# Patient Record
Sex: Male | Born: 1960 | Race: Asian | Hispanic: No | Marital: Single | Smoking: Never smoker
Health system: Southern US, Community
[De-identification: ages and names within clinical notes are randomized; demographics above are authoritative.]

## PROBLEM LIST (undated history)

## (undated) DIAGNOSIS — Z789 Other specified health status: Secondary | ICD-10-CM

## (undated) HISTORY — PX: NO PAST SURGERIES: SHX2092

---

## 2015-09-01 ENCOUNTER — Emergency Department (HOSPITAL_COMMUNITY): Payer: BLUE CROSS/BLUE SHIELD

## 2015-09-01 ENCOUNTER — Ambulatory Visit (INDEPENDENT_AMBULATORY_CARE_PROVIDER_SITE_OTHER): Payer: BLUE CROSS/BLUE SHIELD

## 2015-09-01 ENCOUNTER — Ambulatory Visit (INDEPENDENT_AMBULATORY_CARE_PROVIDER_SITE_OTHER): Payer: BLUE CROSS/BLUE SHIELD | Admitting: Physician Assistant

## 2015-09-01 ENCOUNTER — Inpatient Hospital Stay (HOSPITAL_COMMUNITY)
Admission: EM | Admit: 2015-09-01 | Discharge: 2015-09-04 | DRG: 373 | Disposition: A | Discharge status: Home / Self Care | Payer: BLUE CROSS/BLUE SHIELD | Attending: General Surgery | Admitting: General Surgery

## 2015-09-01 ENCOUNTER — Encounter (HOSPITAL_COMMUNITY): Payer: Self-pay | Admitting: Emergency Medicine

## 2015-09-01 VITALS — BP 126/80 | HR 81 | Temp 98.9°F | Resp 17 | Ht 62.5 in | Wt 135.0 lb

## 2015-09-01 DIAGNOSIS — D72829 Elevated white blood cell count, unspecified: Secondary | ICD-10-CM | POA: Diagnosis present

## 2015-09-01 DIAGNOSIS — K353 Acute appendicitis with localized peritonitis, without perforation or gangrene: Secondary | ICD-10-CM

## 2015-09-01 DIAGNOSIS — K3532 Acute appendicitis with perforation and localized peritonitis, without abscess: Secondary | ICD-10-CM

## 2015-09-01 DIAGNOSIS — L309 Dermatitis, unspecified: Secondary | ICD-10-CM | POA: Diagnosis not present

## 2015-09-01 DIAGNOSIS — R1031 Right lower quadrant pain: Secondary | ICD-10-CM

## 2015-09-01 DIAGNOSIS — K5900 Constipation, unspecified: Secondary | ICD-10-CM

## 2015-09-01 LAB — POCT CBC
Granulocyte percent: 83.3 %G — AB (ref 37–80)
HCT, POC: 41.5 % — AB (ref 43.5–53.7)
HEMOGLOBIN: 14.2 g/dL (ref 14.1–18.1)
LYMPH, POC: 1.5 (ref 0.6–3.4)
MCH, POC: 28.9 pg (ref 27–31.2)
MCHC: 34.2 g/dL (ref 31.8–35.4)
MCV: 84.4 fL (ref 80–97)
MID (cbc): 0.2 (ref 0–0.9)
MPV: 6.1 fL (ref 0–99.8)
POC Granulocyte: 8.5 — AB (ref 2–6.9)
POC LYMPH PERCENT: 14.5 %L (ref 10–50)
POC MID %: 2.2 %M (ref 0–12)
Platelet Count, POC: 267 10*3/uL (ref 142–424)
RBC: 4.92 M/uL (ref 4.69–6.13)
RDW, POC: 14.5 %
WBC: 10.2 10*3/uL (ref 4.6–10.2)

## 2015-09-01 LAB — DIFFERENTIAL
Basophils Absolute: 0 10*3/uL (ref 0.0–0.1)
Basophils Relative: 0 %
EOS PCT: 4 %
Eosinophils Absolute: 0.5 10*3/uL (ref 0.0–0.7)
LYMPHS ABS: 1.7 10*3/uL (ref 0.7–4.0)
LYMPHS PCT: 14 %
MONO ABS: 0.6 10*3/uL (ref 0.1–1.0)
MONOS PCT: 5 %
Neutro Abs: 8.9 10*3/uL — ABNORMAL HIGH (ref 1.7–7.7)
Neutrophils Relative %: 77 %

## 2015-09-01 LAB — POCT URINALYSIS DIP (MANUAL ENTRY)
Bilirubin, UA: NEGATIVE
Glucose, UA: NEGATIVE
Ketones, POC UA: NEGATIVE
LEUKOCYTES UA: NEGATIVE
NITRITE UA: NEGATIVE
PH UA: 7
PROTEIN UA: NEGATIVE
Spec Grav, UA: 1.02
Urobilinogen, UA: 0.2

## 2015-09-01 LAB — COMPREHENSIVE METABOLIC PANEL
ALBUMIN: 4.1 g/dL (ref 3.5–5.0)
ALK PHOS: 58 U/L (ref 38–126)
ALT: 21 U/L (ref 17–63)
ANION GAP: 8 (ref 5–15)
AST: 22 U/L (ref 15–41)
BILIRUBIN TOTAL: 0.5 mg/dL (ref 0.3–1.2)
BUN: 18 mg/dL (ref 6–20)
CALCIUM: 8.8 mg/dL — AB (ref 8.9–10.3)
CO2: 26 mmol/L (ref 22–32)
Chloride: 104 mmol/L (ref 101–111)
Creatinine, Ser: 0.84 mg/dL (ref 0.61–1.24)
GFR calc Af Amer: >60 mL/min (ref >60)
GLUCOSE: 99 mg/dL (ref 65–99)
POTASSIUM: 4.3 mmol/L (ref 3.5–5.1)
Sodium: 138 mmol/L (ref 135–145)
TOTAL PROTEIN: 7.7 g/dL (ref 6.5–8.1)

## 2015-09-01 LAB — CBC
HCT: 43.1 % (ref 39.0–52.0)
Hemoglobin: 13.9 g/dL (ref 13.0–17.0)
MCH: 28.4 pg (ref 26.0–34.0)
MCHC: 32.3 g/dL (ref 30.0–36.0)
MCV: 88 fL (ref 78.0–100.0)
Platelets: 239 10*3/uL (ref 150–400)
RBC: 4.9 MIL/uL (ref 4.22–5.81)
RDW: 13.6 % (ref 11.5–15.5)
WBC: 11.8 10*3/uL — AB (ref 4.0–10.5)

## 2015-09-01 LAB — URINALYSIS, ROUTINE W REFLEX MICROSCOPIC
BILIRUBIN URINE: NEGATIVE
Glucose, UA: NEGATIVE mg/dL
KETONES UR: NEGATIVE mg/dL
LEUKOCYTES UA: NEGATIVE
NITRITE: NEGATIVE
PROTEIN: NEGATIVE mg/dL
Specific Gravity, Urine: 1.02 (ref 1.005–1.030)
pH: 7.5 (ref 5.0–8.0)

## 2015-09-01 LAB — URINE MICROSCOPIC-ADD ON: Bacteria, UA: NONE SEEN

## 2015-09-01 LAB — POC MICROSCOPIC URINALYSIS (UMFC): Mucus: ABSENT

## 2015-09-01 LAB — LIPASE, BLOOD: Lipase: 24 U/L (ref 11–51)

## 2015-09-01 MED ORDER — METRONIDAZOLE IN NACL 5-0.79 MG/ML-% IV SOLN
500.0000 mg | Freq: Once | INTRAVENOUS | Status: AC
  Administered 2015-09-01: 500 mg via INTRAVENOUS
  Filled 2015-09-01: qty 100

## 2015-09-01 MED ORDER — DEXTROSE 5 % IV SOLN
1.0000 g | Freq: Once | INTRAVENOUS | Status: AC
  Administered 2015-09-01: 1 g via INTRAVENOUS
  Filled 2015-09-01: qty 10

## 2015-09-01 MED ORDER — SODIUM CHLORIDE 0.9 % IV BOLUS (SEPSIS)
1000.0000 mL | Freq: Once | INTRAVENOUS | Status: AC
  Administered 2015-09-01: 1000 mL via INTRAVENOUS

## 2015-09-01 MED ORDER — IOHEXOL 300 MG/ML  SOLN
100.0000 mL | Freq: Once | INTRAMUSCULAR | Status: AC | PRN
  Administered 2015-09-01: 100 mL via INTRAVENOUS

## 2015-09-01 MED ORDER — INFLUENZA VAC SPLIT QUAD 0.5 ML IM SUSY
0.5000 mL | PREFILLED_SYRINGE | INTRAMUSCULAR | Status: DC
Start: 2015-09-02 — End: 2015-09-02
  Filled 2015-09-01: qty 0.5

## 2015-09-01 MED ORDER — MORPHINE SULFATE (PF) 4 MG/ML IV SOLN
4.0000 mg | Freq: Once | INTRAVENOUS | Status: AC
  Administered 2015-09-01: 4 mg via INTRAVENOUS
  Filled 2015-09-01: qty 1

## 2015-09-01 NOTE — Progress Notes (Signed)
[facility] [address] [place] [address] [phone]  Date:  09/01/2015   Name:  Adam Ruiz   DOB:  12-20-1960   MRN:  [id number]  PCP:  No primary care provider on file.    History of Present Illness:  Adam Ruiz is a 55 y.o. male patient who presents to [facility] for cc of abdominal pain for 3 days.  This is at the central abdominal pain that moves around.  The pain is constant.  He has never had this pain before.  No nausea.  No dysuria, hematuria, or frequency.  No fever.  He has a constant headache.  No constipation or diarrhea.  He is having constipation.  He had a cup of coffee and a small meal.  He has no anorexia.      There are no active problems to display for this patient.   History reviewed. No pertinent past medical history.  History reviewed. No pertinent past surgical history.  Social History  Substance Use Topics  . Smoking status: Never Smoker   . Smokeless tobacco: None  . Alcohol Use: No    History reviewed. No pertinent family history.  No Known Allergies  Medication list has been reviewed and updated.  No current outpatient prescriptions on file prior to visit.   No current facility-administered medications on file prior to visit.    ROS ROS otherwise unremarkable unless listed above.   Physical Examination: BP 126/80 mmHg  Pulse 81  Temp(Src) 98.9 F (37.2 C) (Oral)  Resp 17  Ht 5' 2.5" (1.588 m)  Wt 135 lb (61.236 kg)  BMI 24.28 kg/m2  SpO2 99% Ideal Body Weight: Weight in (lb) to have BMI = 25: 138.6  Physical Exam  Constitutional: He is oriented to person, place, and time. He appears well-developed and well-nourished. No distress.  HENT:  Head: Normocephalic and atraumatic.  Eyes: Conjunctivae and EOM are normal. Pupils are equal, round, and reactive to light.  Cardiovascular: Normal rate.   Pulmonary/Chest: Effort normal. No respiratory distress.  Abdominal: Soft. Normal appearance and bowel sounds are  normal. There is no hepatosplenomegaly. There is tenderness in the right lower quadrant. There is rigidity, rebound, guarding and tenderness at McBurney's point. There is no CVA tenderness.  Referred pain at rlq with LLQ deep palpation.  Neurological: He is alert and oriented to person, place, and time.  Skin: Skin is warm and dry. He is not diaphoretic.  Dry scaly patches along the upper arms and inferiorly to the lateral side of upper arm.  No erythema or drainage.    Psychiatric: He has a normal mood and affect. His behavior is normal.   Results for orders placed or performed in visit on 09/01/15  POCT CBC  Result Value Ref Range   WBC 10.2 4.6 - 10.2 K/uL   Lymph, poc 1.5 0.6 - 3.4   POC LYMPH PERCENT 14.5 10 - 50 %L   MID (cbc) 0.2 0 - 0.9   POC MID % 2.2 0 - 12 %M   POC Granulocyte 8.5 (A) 2 - 6.9   Granulocyte percent 83.3 (A) 37 - 80 %G   RBC 4.92 4.69 - 6.13 M/uL   Hemoglobin 14.2 14.1 - 18.1 g/dL   HCT, POC 29.5 (A) 62.1 - 53.7 %   MCV 84.4 80 - 97 fL   MCH, POC 28.9 27 - 31.2 pg   MCHC 34.2 31.8 - 35.4 g/dL   RDW, POC 30.8 %  Platelet Count, POC 267 142 - 424 K/uL   MPV 6.1 0 - 99.8 fL  POCT urinalysis dipstick  Result Value Ref Range   Color, UA yellow yellow   Clarity, UA clear clear   Glucose, UA negative negative   Bilirubin, UA negative negative   Ketones, POC UA negative negative   Spec Grav, UA 1.020    Blood, UA trace-lysed (A) negative   pH, UA 7.0    Protein Ur, POC negative negative   Urobilinogen, UA 0.2    Nitrite, UA Negative Negative   Leukocytes, UA Negative Negative  POCT Microscopic Urinalysis ([facility])  Result Value Ref Range   WBC,UR,HPF,POC None None WBC/hpf   RBC,UR,HPF,POC None None RBC/hpf   Bacteria Few (A) None, Too numerous to count   Mucus Absent Absent   Epithelial Cells, UR Per Microscopy Few (A) None, Too numerous to count cells/hpf      Assessment and Plan: Adam Ruiz is a 55 y.o. male who is here today for rlq abdominal  pain.  DIFF DX: Appendicitis (w,w/o encased ruptured abscess), kidney stone, bowel obstruction, peritoneal abscess. Abdominal pain is acute, and must rule out acute processes.  I am sending him to [facility].  Advised aunt to transport him to [facility].  Spoke with charge nurse Mecon, of patient's PE, and high-risk.  Advised patient of no food or drink.  Right lower quadrant abdominal pain - Plan: POCT CBC, POCT urinalysis dipstick, POCT Microscopic Urinalysis ([facility]), DG Abd 1 View  Trena Platt, PA-C [facility] 09/01/2015 10:41 AM

## 2015-09-01 NOTE — ED Notes (Signed)
Pt c/o RLQ pain with rebound tenderness x 3 days. Denies N/V/D/Fevers. A&Ox4 and ambulatory. Pt does not speak english, has family member with him who can interpret. Pt was seen at urgent care and had xray done. Xray was negative. WBC10.2, Few bacteria in urine. Urgent care had possible concern for rupture appendix.

## 2015-09-01 NOTE — H&P (Signed)
Chief Complaint:  Right lower quadrant pain  History of Present Illness:  Adam Ruiz is an 55 y.o. male from [place] who presents with a 3 day history of worsening right lower quadrant pain.  His family took him to an urgent care today and he was referred to the [facility] ER.  CT scan obtained and reviewed by me shows a large phlegmon which with his history likely is a walled off perforated appendicitis.    History reviewed. No pertinent past medical history.  History reviewed. No pertinent past surgical history.  Current Facility-Administered Medications  Medication Dose Route Frequency Provider Last Rate Last Dose  . [START ON 09/02/2015] Influenza vac split quadrivalent PF (FLUARIX) injection 0.5 mL  0.5 mL Intramuscular Tomorrow-1000 Sharlett Iles, MD       Current Outpatient Prescriptions  Medication Sig Dispense Refill  . acetaminophen (TYLENOL) 500 MG tablet Take 500-1,000 mg by mouth every 6 (six) hours as needed for moderate pain or headache.     Review of patient's allergies indicates no known allergies. No family history on file. Social History:   reports that he has never smoked. He does not have any smokeless tobacco history on file. He reports that he does not drink alcohol or use illicit drugs.   REVIEW OF SYSTEMS : Negative   Physical Exam:   Blood pressure 133/79, pulse 77, temperature 98 F (36.7 C), temperature source Oral, resp. rate 18, SpO2 96 %. There is no weight on file to calculate BMI.  Gen:  WDWN Asian male NAD  Neurological: Alert and oriented to person, place, and time. Motor and sensory function is grossly intact  Head: Normocephalic and atraumatic.  Eyes: Conjunctivae are normal. Pupils are equal, round, and reactive to light. No scleral icterus.  Neck: Normal range of motion. Neck supple. No tracheal deviation or thyromegaly present.  Cardiovascular:  SR without murmurs or gallops.  No carotid bruits Breast:  Not examined Respiratory: Effort normal.   No respiratory distress. No chest wall tenderness. Breath sounds normal.  No wheezes, rales or rhonchi.  Abdomen:  Tender and guarding in the right lower quadrant GU:  Male genitals Musculoskeletal: Normal range of motion. Extremities are nontender. No cyanosis, edema or clubbing noted Lymphadenopathy: No cervical, preauricular, postauricular or axillary adenopathy is present Skin: Skin is warm and dry. No rash noted. No diaphoresis. No erythema. No pallor. Pscyh: Normal mood and affect. Behavior is normal. Judgment and thought content normal.   LABORATORY RESULTS: Results for orders placed or performed during the hospital encounter of 09/01/15 (from the past 48 hour(s))  Lipase, blood     Status: None   Collection Time: 09/01/15  2:08 PM  Result Value Ref Range   Lipase 24 11 - 51 U/L  Comprehensive metabolic panel     Status: Abnormal   Collection Time: 09/01/15  2:08 PM  Result Value Ref Range   Sodium 138 135 - 145 mmol/L   Potassium 4.3 3.5 - 5.1 mmol/L   Chloride 104 101 - 111 mmol/L   CO2 26 22 - 32 mmol/L   Glucose, Bld 99 65 - 99 mg/dL   BUN 18 6 - 20 mg/dL   Creatinine, Ser 0.84 0.61 - 1.24 mg/dL   Calcium 8.8 (L) 8.9 - 10.3 mg/dL   Total Protein 7.7 6.5 - 8.1 g/dL   Albumin 4.1 3.5 - 5.0 g/dL   AST 22 15 - 41 U/L   ALT 21 17 - 63 U/L   Alkaline Phosphatase  58 38 - 126 U/L   Total Bilirubin 0.5 0.3 - 1.2 mg/dL   GFR calc non Af Amer >60 >60 mL/min   GFR calc Af Amer >60 >60 mL/min    Comment: (NOTE) The eGFR has been calculated using the CKD EPI equation. This calculation has not been validated in all clinical situations. eGFR's persistently <60 mL/min signify possible Chronic Kidney Disease.    Anion gap 8 5 - 15  CBC     Status: Abnormal   Collection Time: 09/01/15  2:08 PM  Result Value Ref Range   WBC 11.8 (H) 4.0 - 10.5 K/uL   RBC 4.90 4.22 - 5.81 MIL/uL   Hemoglobin 13.9 13.0 - 17.0 g/dL   HCT 43.1 39.0 - 52.0 %   MCV 88.0 78.0 - 100.0 fL   MCH 28.4  26.0 - 34.0 pg   MCHC 32.3 30.0 - 36.0 g/dL   RDW 13.6 11.5 - 15.5 %   Platelets 239 150 - 400 K/uL  Differential     Status: Abnormal   Collection Time: 09/01/15  2:08 PM  Result Value Ref Range   Neutrophils Relative % 77 %   Neutro Abs 8.9 (H) 1.7 - 7.7 K/uL   Lymphocytes Relative 14 %   Lymphs Abs 1.7 0.7 - 4.0 K/uL   Monocytes Relative 5 %   Monocytes Absolute 0.6 0.1 - 1.0 K/uL   Eosinophils Relative 4 %   Eosinophils Absolute 0.5 0.0 - 0.7 K/uL   Basophils Relative 0 %   Basophils Absolute 0.0 0.0 - 0.1 K/uL  Urinalysis, Routine w reflex microscopic (not at [facility])     Status: Abnormal   Collection Time: 09/01/15  2:54 PM  Result Value Ref Range   Color, Urine YELLOW YELLOW   APPearance CLOUDY (A) CLEAR   Specific Gravity, Urine 1.020 1.005 - 1.030   pH 7.5 5.0 - 8.0   Glucose, UA NEGATIVE NEGATIVE mg/dL   Hgb urine dipstick TRACE (A) NEGATIVE   Bilirubin Urine NEGATIVE NEGATIVE   Ketones, ur NEGATIVE NEGATIVE mg/dL   Protein, ur NEGATIVE NEGATIVE mg/dL   Nitrite NEGATIVE NEGATIVE   Leukocytes, UA NEGATIVE NEGATIVE  Urine microscopic-add on     Status: Abnormal   Collection Time: 09/01/15  2:54 PM  Result Value Ref Range   Squamous Epithelial / LPF 0-5 (A) NONE SEEN   WBC, UA 0-5 0 - 5 WBC/hpf   RBC / HPF 0-5 0 - 5 RBC/hpf   Bacteria, UA NONE SEEN NONE SEEN   Urine-Other AMORPHOUS URATES/PHOSPHATES      RADIOLOGY RESULTS: Dg Abd 1 View  09/01/2015  CLINICAL DATA:  cc of abdominal pain for 3 days. This is at the central abdominal pain that moves around. The pain is constant. He has never had this pain before. EXAM: ABDOMEN - 1 VIEW COMPARISON:  None. FINDINGS: Gas within normal caliber large and small bowel. No abnormal abdominal calcifications. No appendicolith. No significant air-fluid levels or evidence of free intraperitoneal air. IMPRESSION: No acute findings. Electronically Signed   By: Abigail Miyamoto M.D.   On: 09/01/2015 12:44   Ct Abdomen Pelvis W  Contrast  09/01/2015  CLINICAL DATA:  55 year old presenting with 3 day history of right lower quadrant abdominal pain and tenderness. Mild leukocytosis with white blood count 10200. EXAM: CT ABDOMEN AND PELVIS WITH CONTRAST TECHNIQUE: Multidetector CT imaging of the abdomen and pelvis was performed using the standard protocol following bolus administration of intravenous contrast. CONTRAST:  157m OMNIPAQUE IOHEXOL  300 MG/ML IV. Oral contrast was also administered. COMPARISON:  None. FINDINGS: Respiratory motion blurred images throughout the examination, but a diagnostic study was obtained. Lower chest: Heart size upper normal to slightly enlarged. Visualized lung bases clear. Hepatobiliary: Liver normal in size and appearance. Gallbladder normal in appearance without calcified gallstones. No biliary ductal dilation. Pancreas: Normal in appearance without evidence of mass, ductal dilation, or inflammation. Spleen: Normal in size and appearance. Adrenals/Urinary Tract: Normal appearing adrenal glands. High attenuation 8 mm cyst involving the upper pole of the left kidney. No solid renal masses. No urinary tract calculi. No hydronephrosis. Normal-appearing decompressed urinary bladder. Stomach/Bowel: Phlegmonous change/inflammatory mass at the base of the cecum. The appendix is difficult to identify within this inflammatory mass. Mild thickening of the wall of the distal and terminal ileum adjacent to the inflammatory mass. Small bowel otherwise normal in appearance. Stomach normal in appearance for the degree of distention. Solitary diverticulum arising from the distal descending colon without evidence of acute diverticulitis. Remainder of the colon unremarkable. No ascites. Vascular/Lymphatic: Mild aortoiliac atherosclerosis without aneurysm. Patent visceral arteries. No pathologic lymphadenopathy. Reproductive: Moderate to marked median lobe prostate gland enlargement. Prominent seminal vesicles without focal  mass. Other: None. 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 No acute abnormality. IMPRESSION: 1. Severe acute appendicitis with phlegmonous change/inflammatory mass. No extraluminal gas to confirm perforation. The appendix is difficult to identify within the inflammatory mass. No evidence of abscess currently. 2. Secondary mild inflammation of the distal and terminal ileum. 3. 8 mm likely hemorrhagic or proteinaceous cyst involving the upper pole of the left kidney. 4. Moderate to marked median lobe prostate gland enlargement for age. I telephoned these results at the time of interpretation on 09/01/2015 at 6:27 pm to [facility] CAMPRUBI-SOMS, PA, who verbally acknowledged these results. Electronically Signed   By: Evangeline Dakin M.D.   On: 09/01/2015 18:29    Problem List: Patient Active Problem List   Diagnosis Date Noted  . Appendicitis with perforation 09/01/2015    Assessment & Plan: Appendiceal phlegmon.  Rocephin and Flagyl begun.  Will admit for IV antibiotics and reevaluation.  If he improves on antibiotics then perhaps he can have an interval laparoscopic appendectomy.      Matt B. Hassell Done, MD, [facility] [phone] beeper [phone]  09/01/2015 8:50 PM

## 2015-09-01 NOTE — ED Notes (Signed)
Pt resting in bed waiting on consult.  Family at bedside

## 2015-09-01 NOTE — ED Provider Notes (Signed)
CSN: [id number]     Arrival date & time 09/01/15  1322 History   First MD Initiated Contact with Patient 09/01/15 1542     Chief Complaint  Patient presents with  . Abdominal Pain     (Consider location/radiation/quality/duration/timing/severity/associated sxs/prior Treatment) HPI Comments: Sheron Robin is a 55 y.o. male with no PMHx, who presents to the ED accompanied by his Aunt who helps provide some of the history (translating, pt speaks [place]), with complaints of lower abdominal pain 3 days, gradually worsening, with right lower quadrant worse than the left lower quadrant. Reports he went to urgent care, had xray done there which was unremarkable, and labs that showed WBC 10.2, and they sent him here for further eval and to r/o appendicitis. He describes the pain is 10/10 constant squeezing lower abdominal pain which radiates to his lower back, worse with walking, with no treatments tried prior to arrival. Associated symptoms include constipation stating that he has been having small hard bowel movements, the last one being this morning. He denies any fevers, chills, chest pain, shortness breath, nausea, vomiting, diarrhea, melena, hematochezia, obstipation, dysuria, hematuria, numbness, tingling, or focal weakness. He denies any recent travel, sick contacts, suspicious food intake, alcohol use, NSAID use, or prior abdominal surgeries. He takes no medications on a regular basis and has no medical conditions that he knows of. Last meal was 7am. NKDA.  Patient is a 55 y.o. male presenting with abdominal pain. The history is provided by the patient and a relative. The history is limited by a language barrier. A language interpreter was used (aunt).  Abdominal Pain Pain location:  RLQ and LLQ Pain quality: squeezing   Pain radiates to:  Back Pain severity:  Severe Onset quality:  Gradual Duration:  3 days Timing:  Constant Progression:  Worsening Chronicity:  New Context: not recent  travel, not sick contacts and not suspicious food intake   Relieved by:  None tried Worsened by:  Movement Ineffective treatments:  None tried Associated symptoms: constipation (small hard BM this AM)   Associated symptoms: no chest pain, no chills, no diarrhea, no dysuria, no fever, no flatus, no hematemesis, no hematochezia, no hematuria, no melena, no nausea, no shortness of breath and no vomiting   Risk factors: no alcohol abuse, has not had multiple surgeries and no NSAID use     History reviewed. No pertinent past medical history. History reviewed. No pertinent past surgical history. No family history on file. Social History  Substance Use Topics  . Smoking status: Never Smoker   . Smokeless tobacco: None  . Alcohol Use: No    Review of Systems  Constitutional: Negative for fever and chills.  Respiratory: Negative for shortness of breath.   Cardiovascular: Negative for chest pain.  Gastrointestinal: Positive for abdominal pain and constipation (small hard BM this AM). Negative for nausea, vomiting, diarrhea, blood in stool, melena, hematochezia, anal bleeding, flatus and hematemesis.  Genitourinary: Negative for dysuria, hematuria and flank pain.  Musculoskeletal: Negative for myalgias and arthralgias.  Skin: Negative for color change.  Allergic/Immunologic: Negative for immunocompromised state.  Neurological: Negative for weakness and numbness.  Psychiatric/Behavioral: Negative for confusion.   10 Systems reviewed and are negative for acute change except as noted in the HPI.    Allergies  Review of patient's allergies indicates no known allergies.  Home Medications   Prior to Admission medications   Not on File   BP 138/87 mmHg  Pulse 69  Temp(Src) 98 F (36.7 C) (Oral)  Resp 16  SpO2 100% Physical Exam  Constitutional: He is oriented to person, place, and time. Vital signs are normal. He appears well-developed and well-nourished.  Non-toxic appearance. No  distress.  Afebrile, nontoxic, NAD  HENT:  Head: Normocephalic and atraumatic.  Mouth/Throat: Oropharynx is clear and moist and mucous membranes are normal.  Eyes: Conjunctivae and EOM are normal. Right eye exhibits no discharge. Left eye exhibits no discharge.  Neck: Normal range of motion. Neck supple.  Cardiovascular: Normal rate, regular rhythm, normal heart sounds and intact distal pulses.  Exam reveals no gallop and no friction rub.   No murmur heard. Pulmonary/Chest: Effort normal and breath sounds normal. No respiratory distress. He has no decreased breath sounds. He has no wheezes. He has no rhonchi. He has no rales.  Abdominal: Soft. Normal appearance and bowel sounds are normal. He exhibits no distension. There is tenderness. There is rebound, guarding and tenderness at McBurney's point. There is no rigidity, no CVA tenderness and negative Murphy's sign.    Soft, nondistended, +BS throughout, with diffuse lower abd TTP with RLQ>LLQ, +voluntary guarding, +rebound tenderness, no rigidity, +rovsing's sign, neg murphy's, +mcburney's point TTP, no CVA TTP, neg foot tap test, neg psoas sign   Musculoskeletal: Normal range of motion.  Neurological: He is alert and oriented to person, place, and time. He has normal strength. No sensory deficit.  Skin: Skin is warm, dry and intact. No rash noted.  Psychiatric: He has a normal mood and affect.  Nursing note and vitals reviewed.   ED Course  Procedures (including critical care time) Labs Review Labs Reviewed  COMPREHENSIVE METABOLIC PANEL - Abnormal; Notable for the following:    Calcium 8.8 (*)    All other components within normal limits  CBC - Abnormal; Notable for the following:    WBC 11.8 (*)    All other components within normal limits  URINALYSIS, ROUTINE W REFLEX MICROSCOPIC (NOT AT [facility]) - Abnormal; Notable for the following:    APPearance CLOUDY (*)    Hgb urine dipstick TRACE (*)    All other components within normal  limits  URINE MICROSCOPIC-ADD ON - Abnormal; Notable for the following:    Squamous Epithelial / LPF 0-5 (*)    All other components within normal limits  DIFFERENTIAL - Abnormal; Notable for the following:    Neutro Abs 8.9 (*)    All other components within normal limits  URINE CULTURE  LIPASE, BLOOD    Imaging Review Dg Abd 1 View  09/01/2015  CLINICAL DATA:  cc of abdominal pain for 3 days. This is at the central abdominal pain that moves around. The pain is constant. He has never had this pain before. EXAM: ABDOMEN - 1 VIEW COMPARISON:  None. FINDINGS: Gas within normal caliber large and small bowel. No abnormal abdominal calcifications. No appendicolith. No significant air-fluid levels or evidence of free intraperitoneal air. IMPRESSION: No acute findings. Electronically Signed   By: Jeronimo Greaves M.D.   On: 09/01/2015 12:44   Ct Abdomen Pelvis W Contrast  09/01/2015  CLINICAL DATA:  55 year old presenting with 3 day history of right lower quadrant abdominal pain and tenderness. Mild leukocytosis with white blood count 10200. EXAM: CT ABDOMEN AND PELVIS WITH CONTRAST TECHNIQUE: Multidetector CT imaging of the abdomen and pelvis was performed using the standard protocol following bolus administration of intravenous contrast. CONTRAST:  OMNIPAQUE IOHEXOL 300 MG/ML IV. Oral contrast was also administered. COMPARISON:  None. FINDINGS: Respiratory motion blurred images throughout the examination,  but a diagnostic study was obtained. Lower chest: Heart size upper normal to slightly enlarged. Visualized lung bases clear. Hepatobiliary: Liver normal in size and appearance. Gallbladder normal in appearance without calcified gallstones. No biliary ductal dilation. Pancreas: Normal in appearance without evidence of mass, ductal dilation, or inflammation. Spleen: Normal in size and appearance. Adrenals/Urinary Tract: Normal appearing adrenal glands. High attenuation 8 mm cyst involving the upper pole  of the left kidney. No solid renal masses. No urinary tract calculi. No hydronephrosis. Normal-appearing decompressed urinary bladder. Stomach/Bowel: Phlegmonous change/inflammatory mass at the base of the cecum. The appendix is difficult to identify within this inflammatory mass. Mild thickening of the wall of the distal and terminal ileum adjacent to the inflammatory mass. Small bowel otherwise normal in appearance. Stomach normal in appearance for the degree of distention. Solitary diverticulum arising from the distal descending colon without evidence of acute diverticulitis. Remainder of the colon unremarkable. No ascites. Vascular/Lymphatic: Mild aortoiliac atherosclerosis without aneurysm. Patent visceral arteries. No pathologic lymphadenopathy. Reproductive: Moderate to marked median lobe prostate gland enlargement. Prominent seminal vesicles without focal mass. Other: None. 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 No acute abnormality. IMPRESSION: 1. Severe acute appendicitis with phlegmonous change/inflammatory mass. No extraluminal gas to confirm perforation. The appendix is difficult to identify within the inflammatory mass. No evidence of abscess currently. 2. Secondary mild inflammation of the distal and terminal ileum. 3. 8 mm likely hemorrhagic or proteinaceous cyst involving the upper pole of the left kidney. 4. Moderate to marked median lobe prostate gland enlargement for age. I telephoned these results at the time of interpretation on 09/01/2015 at 6:27 pm to [facility] CAMPRUBI-SOMS, PA, who verbally acknowledged these results. Electronically Signed   By: Hulan Saas M.D.   On: 09/01/2015 18:29   I have personally reviewed and evaluated these images and lab results as part of my medical decision-making.   EKG Interpretation None      MDM   Final diagnoses:  RLQ abdominal pain  Constipation,  unspecified constipation type  Leukocytosis  Acute appendicitis with localized peritonitis    55 y.o. male here with 3 days of worsening lower abd pain, R>L. On exam, RLQ TTP with guarding. Sent [facility] for r/o appendicitis, 1V Abd xray there was neg. Exam consistent with appendicitis, or at least concerning for it. He does endorse some constipation, last BM this AM, which could be possible other etiology. On exam, RLQ TTP with voluntary guarding and rebound tenderness. Labs reveal U/A with trace hgb, 0-5 WBC and RBC, 0-5 squamous with no bacteria, doubt UTI but will send this for culture since pt is male. Lipase WNL, CMP WNL. CBC with leukocytosis 11.8, will add on differential. Will give fluids and pain meds, and order CT abd/pelv. Will reassess shortly.   6:30 PM Differential reveals left shift with neutrophilic predominance. Radiologist calling me, states this is definite acute appendicitis with a lot of inflammation but no abscess or perforation. Will start rocephin/flagyl, and consult surgery. Interpreter called to help translate, pt states he understands after translator explained what appendicitis was. He has no questions at this time. Dr. Clarene Duke saw pt as well, agrees with plan.  7:04 PM Dr. Daphine Deutscher calling, states he will admit pt, wants abx started, agrees with rocephin/flagyl. Will be down to see pt shortly. Does not want to have holding orders placed, he will place all further orders. Pt comfortable at this time, states pain is  under control. Please see Dr. Ermalene Searing notes for further documentation of care.  BP 135/86 mmHg  Pulse 72  Temp(Src) 98 F (36.7 C) (Oral)  Resp 18  SpO2 99%  Meds ordered this encounter  Medications  . sodium chloride 0.9 % bolus 1,000 mL    Sig:   . morphine 4 MG/ML injection 4 mg    Sig:   . iohexol (OMNIPAQUE) 300 MG/ML solution 100 mL    Sig:   . cefTRIAXone (ROCEPHIN) 1 g in dextrose 5 % 50 mL IVPB    Sig:   . metroNIDAZOLE (FLAGYL) IVPB  500 mg    Sig:     Order Specific Question:  Antibiotic Indication:    Answer:  Intra-abdominal Infection     Allen Derry, PA-C 09/01/15 1905  Laurence Spates, MD 09/01/15 [phone]

## 2015-09-01 NOTE — Patient Instructions (Addendum)
You will immediately go to [facility].  I have advised the charge nurse that you will need to be seen asap, so please say this.  Please do not drink or eat at this time.      Because you received an x-ray today, you will receive an invoice from [facility]. Please contact [facility] at [phone] with questions or concerns regarding your invoice. Our billing staff will not be able to assist you with those questions.

## 2015-09-02 LAB — BASIC METABOLIC PANEL
ANION GAP: 8 (ref 5–15)
BUN: 14 mg/dL (ref 6–20)
CO2: 24 mmol/L (ref 22–32)
Calcium: 8.6 mg/dL — ABNORMAL LOW (ref 8.9–10.3)
Chloride: 106 mmol/L (ref 101–111)
Creatinine, Ser: 0.93 mg/dL (ref 0.61–1.24)
GFR calc Af Amer: >60 mL/min (ref >60)
GLUCOSE: 115 mg/dL — AB (ref 65–99)
POTASSIUM: 3.9 mmol/L (ref 3.5–5.1)
Sodium: 138 mmol/L (ref 135–145)

## 2015-09-02 LAB — CBC WITH DIFFERENTIAL/PLATELET
BASOS ABS: 0 10*3/uL (ref 0.0–0.1)
Basophils Relative: 0 %
Eosinophils Absolute: 0.2 10*3/uL (ref 0.0–0.7)
Eosinophils Relative: 2 %
HCT: 40.9 % (ref 39.0–52.0)
Hemoglobin: 13 g/dL (ref 13.0–17.0)
LYMPHS PCT: 12 %
Lymphs Abs: 1.1 10*3/uL (ref 0.7–4.0)
MCH: 28.3 pg (ref 26.0–34.0)
MCHC: 31.8 g/dL (ref 30.0–36.0)
MCV: 88.9 fL (ref 78.0–100.0)
MONO ABS: 0.5 10*3/uL (ref 0.1–1.0)
Monocytes Relative: 5 %
NEUTROS ABS: 6.9 10*3/uL (ref 1.7–7.7)
NEUTROS PCT: 81 %
PLATELETS: 232 10*3/uL (ref 150–400)
RBC: 4.6 MIL/uL (ref 4.22–5.81)
RDW: 13.7 % (ref 11.5–15.5)
WBC: 8.7 10*3/uL (ref 4.0–10.5)

## 2015-09-02 LAB — URINE CULTURE: Culture: 2000

## 2015-09-02 MED ORDER — ONDANSETRON HCL 4 MG PO TABS: 4.0000 mg | ORAL_TABLET | Freq: Four times a day (QID) | ORAL | Status: DC | PRN

## 2015-09-02 MED ORDER — DEXTROSE 5 % IV SOLN
2.0000 g | INTRAVENOUS | Status: DC
  Administered 2015-09-02: 2 g via INTRAVENOUS
  Filled 2015-09-02: qty 2

## 2015-09-02 MED ORDER — METRONIDAZOLE IN NACL 5-0.79 MG/ML-% IV SOLN
500.0000 mg | Freq: Three times a day (TID) | INTRAVENOUS | Status: DC
  Administered 2015-09-02: 500 mg via INTRAVENOUS
  Filled 2015-09-02 (×2): qty 100

## 2015-09-02 MED ORDER — MORPHINE SULFATE (PF) 2 MG/ML IV SOLN
1.0000 mg | INTRAVENOUS | Status: DC | PRN
  Administered 2015-09-02 – 2015-09-03 (×4): 1 mg via INTRAVENOUS
  Filled 2015-09-02 (×4): qty 1

## 2015-09-02 MED ORDER — ONDANSETRON HCL 4 MG/2ML IJ SOLN: 4.0000 mg | Freq: Four times a day (QID) | INTRAMUSCULAR | Status: DC | PRN

## 2015-09-02 MED ORDER — PIPERACILLIN-TAZOBACTAM 3.375 G IVPB
3.3750 g | Freq: Three times a day (TID) | INTRAVENOUS | Status: DC
Start: 2015-09-02 — End: 2015-09-04
  Administered 2015-09-02 – 2015-09-04 (×7): 3.375 g via INTRAVENOUS
  Filled 2015-09-02 (×8): qty 50

## 2015-09-02 MED ORDER — TRIAMCINOLONE ACETONIDE 0.1 % EX CREA: 1.0000 "application " | TOPICAL_CREAM | Freq: Two times a day (BID) | CUTANEOUS | Status: DC

## 2015-09-02 MED ORDER — HEPARIN SODIUM (PORCINE) 5000 UNIT/ML IJ SOLN
5000.0000 [IU] | Freq: Three times a day (TID) | INTRAMUSCULAR | Status: DC
  Administered 2015-09-02 – 2015-09-04 (×8): 5000 [IU] via SUBCUTANEOUS
  Filled 2015-09-02 (×11): qty 1

## 2015-09-02 MED ORDER — ACETAMINOPHEN 325 MG PO TABS
325.0000 mg | ORAL_TABLET | Freq: Four times a day (QID) | ORAL | Status: DC | PRN
  Administered 2015-09-04: 650 mg via ORAL
  Filled 2015-09-02: qty 2

## 2015-09-02 MED ORDER — KCL IN DEXTROSE-NACL 20-5-0.45 MEQ/L-%-% IV SOLN
INTRAVENOUS | Status: DC
  Administered 2015-09-02 – 2015-09-03 (×4): via INTRAVENOUS
  Filled 2015-09-02 (×9): qty 1000

## 2015-09-02 NOTE — Progress Notes (Signed)
[facility] Progress Note     Subjective: Speaks [place] no English.  He lives at the [facility] [employer].  Pt's pain is improved, but still there.  No N/V.  Thirsty.  Ambulated OOB.  Urinating, no flatus or BM yet.    Objective: Vital signs in last 24 hours: Temp:  [98 F (36.7 C)-100.3 F (37.9 C)] 100.3 F (37.9 C) (01/31 0509) Pulse Rate:  [64-87] 87 (01/31 0509) Resp:  [16-18] 16 (01/31 0509) BP: (120-151)/(75-94) 120/75 mmHg (01/31 0509) SpO2:  [96 %-100 %] 99 % (01/31 0509) Weight:  [59.5 kg (131 lb 2.8 oz)-61.236 kg (135 lb)] 59.5 kg (131 lb 2.8 oz) (01/31 0004) Last BM Date: 09/01/15  Intake/Output from previous day:   Intake/Output this shift:    PE: Gen:  Alert, NAD, pleasant Abd: Soft, mild distension, moderate tenderness in the RLQ, +BS, no HSM, no abdominal scars noted   Lab Results:   Recent Labs  09/01/15 1408 09/02/15 0516  WBC 11.8* 8.7  HGB 13.9 13.0  HCT 43.1 40.9  PLT 239 232   BMET  Recent Labs  09/01/15 1408 09/02/15 0516  NA 138 138  K 4.3 3.9  CL 104 106  CO2 26 24  GLUCOSE 99 115*  BUN 18 14  CREATININE 0.84 0.93  CALCIUM 8.8* 8.6*   PT/INR No results for input(s): LABPROT, INR in the last 72 hours. CMP     Component Value Date/Time   NA 138 09/02/2015 0516   K 3.9 09/02/2015 0516   CL 106 09/02/2015 0516   CO2 24 09/02/2015 0516   GLUCOSE 115* 09/02/2015 0516   BUN 14 09/02/2015 0516   CREATININE 0.93 09/02/2015 0516   CALCIUM 8.6* 09/02/2015 0516   PROT 7.7 09/01/2015 1408   ALBUMIN 4.1 09/01/2015 1408   AST 22 09/01/2015 1408   ALT 21 09/01/2015 1408   ALKPHOS 58 09/01/2015 1408   BILITOT 0.5 09/01/2015 1408   GFRNONAA >60 09/02/2015 0516   GFRAA >60 09/02/2015 0516   Lipase     Component Value Date/Time   LIPASE 24 09/01/2015 1408       Studies/Results: Dg Abd 1 View  09/01/2015  CLINICAL DATA:  cc of abdominal pain for 3 days. This is at the central abdominal pain that moves  around. The pain is constant. He has never had this pain before. EXAM: ABDOMEN - 1 VIEW COMPARISON:  None. FINDINGS: Gas within normal caliber large and small bowel. No abnormal abdominal calcifications. No appendicolith. No significant air-fluid levels or evidence of free intraperitoneal air. IMPRESSION: No acute findings. Electronically Signed   By: Jeronimo Greaves M.D.   On: 09/01/2015 12:44   Ct Abdomen Pelvis W Contrast  09/01/2015  CLINICAL DATA:  55 year old presenting with 3 day history of right lower quadrant abdominal pain and tenderness. Mild leukocytosis with white blood count 10200. EXAM: CT ABDOMEN AND PELVIS WITH CONTRAST TECHNIQUE: Multidetector CT imaging of the abdomen and pelvis was performed using the standard protocol following bolus administration of intravenous contrast. CONTRAST:  OMNIPAQUE IOHEXOL 300 MG/ML IV. Oral contrast was also administered. COMPARISON:  None. FINDINGS: Respiratory motion blurred images throughout the examination, but a diagnostic study was obtained. Lower chest: Heart size upper normal to slightly enlarged. Visualized lung bases clear. Hepatobiliary: Liver normal in size and appearance. Gallbladder normal in appearance without calcified gallstones. No biliary ductal dilation. Pancreas: Normal in appearance without evidence of mass, ductal dilation, or inflammation. Spleen: Normal in size and appearance.  Adrenals/Urinary Tract: Normal appearing adrenal glands. High attenuation 8 mm cyst involving the upper pole of the left kidney. No solid renal masses. No urinary tract calculi. No hydronephrosis. Normal-appearing decompressed urinary bladder. Stomach/Bowel: Phlegmonous change/inflammatory mass at the base of the cecum. The appendix is difficult to identify within this inflammatory mass. Mild thickening of the wall of the distal and terminal ileum adjacent to the inflammatory mass. Small bowel otherwise normal in appearance. Stomach normal in appearance for the  degree of distention. Solitary diverticulum arising from the distal descending colon without evidence of acute diverticulitis. Remainder of the colon unremarkable. No ascites. Vascular/Lymphatic: Mild aortoiliac atherosclerosis without aneurysm. Patent visceral arteries. No pathologic lymphadenopathy. Reproductive: Moderate to marked median lobe prostate gland enlargement. Prominent seminal vesicles without focal mass. Other: None. 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 No acute abnormality. IMPRESSION: 1. Severe acute appendicitis with phlegmonous change/inflammatory mass. No extraluminal gas to confirm perforation. The appendix is difficult to identify within the inflammatory mass. No evidence of abscess currently. 2. Secondary mild inflammation of the distal and terminal ileum. 3. 8 mm likely hemorrhagic or proteinaceous cyst involving the upper pole of the left kidney. 4. Moderate to marked median lobe prostate gland enlargement for age. I telephoned these results at the time of interpretation on 09/01/2015 at 6:27 pm to [facility] CAMPRUBI-SOMS, PA, who verbally acknowledged these results. Electronically Signed   By: Hulan Saas M.D.   On: 09/01/2015 18:29    Anti-infectives: Anti-infectives    Start     Dose/Rate Route Frequency Ordered Stop   09/02/15 0800  piperacillin-tazobactam (ZOSYN) IVPB 3.375 g     3.375 g 12.5 mL/hr over 240 Minutes Intravenous Every 8 hours 09/02/15 0711     09/02/15 0600  cefTRIAXone (ROCEPHIN) 2 g in dextrose 5 % 50 mL IVPB  Status:  Discontinued     2 g 100 mL/hr over 30 Minutes Intravenous Every 24 hours 09/02/15 0012 09/02/15 0711   09/02/15 0400  metroNIDAZOLE (FLAGYL) IVPB 500 mg  Status:  Discontinued     500 mg 100 mL/hr over 60 Minutes Intravenous Every 8 hours 09/02/15 0012 09/02/15 0711   09/01/15 1845  cefTRIAXone (ROCEPHIN) 1 g in dextrose 5 % 50 mL IVPB      1 g 100 mL/hr over 30 Minutes Intravenous  Once 09/01/15 1830 09/01/15 1932   09/01/15 1845  metroNIDAZOLE (FLAGYL) IVPB 500 mg     500 mg 100 mL/hr over 60 Minutes Intravenous  Once 09/01/15 1830 09/01/15 2040       Assessment/Plan Acute perforated appendicitis with phlegmon -Medical management for now, and if not improving in the next few days, may need ileocecectomy. -Rocephin/flagyl switched to Zosyn Day #1 -NPO, bowel rest, IVF, pain control, antiemetics -Ambulate and IS -SCD's and heparin  Discussed his care the patient and his Buddhist monk friend at bedside.  His friend is able to translate for [place].      LOS: 1 day    Nonie Hoyer 09/02/2015, 8:37 AM Pager: [phone]

## 2015-09-02 NOTE — Progress Notes (Signed)
Rx Brief note:  Rocephin Per [employer] policy Rx can increase Rocephin to 2 Gm q24h in pt with intra-abdominal infection.  Thanks, Lorenza Evangelist 09/02/2015 12:18 AM

## 2015-09-03 LAB — CBC
HCT: 41.4 % (ref 39.0–52.0)
Hemoglobin: 13.2 g/dL (ref 13.0–17.0)
MCH: 28.4 pg (ref 26.0–34.0)
MCHC: 31.9 g/dL (ref 30.0–36.0)
MCV: 89 fL (ref 78.0–100.0)
Platelets: 236 10*3/uL (ref 150–400)
RBC: 4.65 MIL/uL (ref 4.22–5.81)
RDW: 13.7 % (ref 11.5–15.5)
WBC: 7.2 10*3/uL (ref 4.0–10.5)

## 2015-09-03 LAB — BASIC METABOLIC PANEL
ANION GAP: 8 (ref 5–15)
BUN: 11 mg/dL (ref 6–20)
CALCIUM: 8.5 mg/dL — AB (ref 8.9–10.3)
CHLORIDE: 105 mmol/L (ref 101–111)
CO2: 24 mmol/L (ref 22–32)
Creatinine, Ser: 1.1 mg/dL (ref 0.61–1.24)
GFR calc non Af Amer: >60 mL/min (ref >60)
GLUCOSE: 122 mg/dL — AB (ref 65–99)
POTASSIUM: 3.8 mmol/L (ref 3.5–5.1)
Sodium: 137 mmol/L (ref 135–145)

## 2015-09-03 NOTE — Progress Notes (Signed)
[facility] Progress Note     Subjective: Pt doing much better, pain ins 5/10, no N/V, thirsty and hungry.  Had a BM this am and passing flatus.  Ambulating OOB.  Urinating well.    Objective: Vital signs in last 24 hours: Temp:  [98.6 F (37 C)-99.9 F (37.7 C)] 98.6 F (37 C) (02/01 0516) Pulse Rate:  [48-73] 48 (02/01 0516) Resp:  [14-16] 15 (02/01 0516) BP: (117-124)/(69-77) 124/73 mmHg (02/01 0516) SpO2:  [92 %-100 %] 92 % (02/01 0516) Last BM Date: 09/01/15  Intake/Output from previous day: 01/31 0701 - 02/01 0700 In: 1155 [I.V.:1105; IV Piggyback:50] Out: -  Intake/Output this shift:    PE: Gen:  Alert, NAD, pleasant Abd: Soft, ND, tenderness in RLQ, +BS, no HSM   Lab Results:   Recent Labs  09/02/15 0516 09/03/15 0515  WBC 8.7 7.2  HGB 13.0 13.2  HCT 40.9 41.4  PLT 232 236   BMET  Recent Labs  09/02/15 0516 09/03/15 0515  NA 138 137  K 3.9 3.8  CL 106 105  CO2 24 24  GLUCOSE 115* 122*  BUN 14 11  CREATININE 0.93 1.10  CALCIUM 8.6* 8.5*   PT/INR No results for input(s): LABPROT, INR in the last 72 hours. CMP     Component Value Date/Time   NA 137 09/03/2015 0515   K 3.8 09/03/2015 0515   CL 105 09/03/2015 0515   CO2 24 09/03/2015 0515   GLUCOSE 122* 09/03/2015 0515   BUN 11 09/03/2015 0515   CREATININE 1.10 09/03/2015 0515   CALCIUM 8.5* 09/03/2015 0515   PROT 7.7 09/01/2015 1408   ALBUMIN 4.1 09/01/2015 1408   AST 22 09/01/2015 1408   ALT 21 09/01/2015 1408   ALKPHOS 58 09/01/2015 1408   BILITOT 0.5 09/01/2015 1408   GFRNONAA >60 09/03/2015 0515   GFRAA >60 09/03/2015 0515   Lipase     Component Value Date/Time   LIPASE 24 09/01/2015 1408       Studies/Results: Dg Abd 1 View  09/01/2015  CLINICAL DATA:  cc of abdominal pain for 3 days. This is at the central abdominal pain that moves around. The pain is constant. He has never had this pain before. EXAM: ABDOMEN - 1 VIEW COMPARISON:  None. FINDINGS: Gas  within normal caliber large and small bowel. No abnormal abdominal calcifications. No appendicolith. No significant air-fluid levels or evidence of free intraperitoneal air. IMPRESSION: No acute findings. Electronically Signed   By: Jeronimo Greaves M.D.   On: 09/01/2015 12:44   Ct Abdomen Pelvis W Contrast  09/01/2015  CLINICAL DATA:  55 year old presenting with 3 day history of right lower quadrant abdominal pain and tenderness. Mild leukocytosis with white blood count 10200. EXAM: CT ABDOMEN AND PELVIS WITH CONTRAST TECHNIQUE: Multidetector CT imaging of the abdomen and pelvis was performed using the standard protocol following bolus administration of intravenous contrast. CONTRAST:  OMNIPAQUE IOHEXOL 300 MG/ML IV. Oral contrast was also administered. COMPARISON:  None. FINDINGS: Respiratory motion blurred images throughout the examination, but a diagnostic study was obtained. Lower chest: Heart size upper normal to slightly enlarged. Visualized lung bases clear. Hepatobiliary: Liver normal in size and appearance. Gallbladder normal in appearance without calcified gallstones. No biliary ductal dilation. Pancreas: Normal in appearance without evidence of mass, ductal dilation, or inflammation. Spleen: Normal in size and appearance. Adrenals/Urinary Tract: Normal appearing adrenal glands. High attenuation 8 mm cyst involving the upper pole of the left kidney. No solid renal masses.  No urinary tract calculi. No hydronephrosis. Normal-appearing decompressed urinary bladder. Stomach/Bowel: Phlegmonous change/inflammatory mass at the base of the cecum. The appendix is difficult to identify within this inflammatory mass. Mild thickening of the wall of the distal and terminal ileum adjacent to the inflammatory mass. Small bowel otherwise normal in appearance. Stomach normal in appearance for the degree of distention. Solitary diverticulum arising from the distal descending colon without evidence of acute  diverticulitis. Remainder of the colon unremarkable. No ascites. Vascular/Lymphatic: Mild aortoiliac atherosclerosis without aneurysm. Patent visceral arteries. No pathologic lymphadenopathy. Reproductive: Moderate to marked median lobe prostate gland enlargement. Prominent seminal vesicles without focal mass. Other: None. 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 No acute abnormality. IMPRESSION: 1. Severe acute appendicitis with phlegmonous change/inflammatory mass. No extraluminal gas to confirm perforation. The appendix is difficult to identify within the inflammatory mass. No evidence of abscess currently. 2. Secondary mild inflammation of the distal and terminal ileum. 3. 8 mm likely hemorrhagic or proteinaceous cyst involving the upper pole of the left kidney. 4. Moderate to marked median lobe prostate gland enlargement for age. I telephoned these results at the time of interpretation on 09/01/2015 at 6:27 pm to [facility] CAMPRUBI-SOMS, PA, who verbally acknowledged these results. Electronically Signed   By: Hulan Saas M.D.   On: 09/01/2015 18:29    Anti-infectives: Anti-infectives    Start     Dose/Rate Route Frequency Ordered Stop   09/02/15 0800  piperacillin-tazobactam (ZOSYN) IVPB 3.375 g     3.375 g 12.5 mL/hr over 240 Minutes Intravenous Every 8 hours 09/02/15 0711     09/02/15 0600  cefTRIAXone (ROCEPHIN) 2 g in dextrose 5 % 50 mL IVPB  Status:  Discontinued     2 g 100 mL/hr over 30 Minutes Intravenous Every 24 hours 09/02/15 0012 09/02/15 0711   09/02/15 0400  metroNIDAZOLE (FLAGYL) IVPB 500 mg  Status:  Discontinued     500 mg 100 mL/hr over 60 Minutes Intravenous Every 8 hours 09/02/15 0012 09/02/15 0711   09/01/15 1845  cefTRIAXone (ROCEPHIN) 1 g in dextrose 5 % 50 mL IVPB     1 g 100 mL/hr over 30 Minutes Intravenous  Once 09/01/15 1830 09/01/15 1932   09/01/15 1845  metroNIDAZOLE  (FLAGYL) IVPB 500 mg     500 mg 100 mL/hr over 60 Minutes Intravenous  Once 09/01/15 1830 09/01/15 2040       Assessment/Plan Acute perforated appendicitis with phlegmon -Continue medical management as he's having some improvements, and if not improving in the next few days, may need ileocecectomy. -Rocephin/flagyl switched to Zosyn Day #2, WBC normal for 2 days and afebrile -NPO, bowel rest, IVF, pain control, antiemetics -Ambulate and IS -SCD's and heparin -As long as improving will work towards discharge home with antibiotics, repeat CT at some poing, and bring back in 6-8 weeks for an interval appendectomy  Discussed his care the patient and his Buddhist monk friend over the phone. His friend is able to translate for [place].     LOS: 2 days    Nonie Hoyer 09/03/2015, 9:11 AM Pager: [phone]

## 2015-09-04 MED ORDER — HYDROCODONE-ACETAMINOPHEN 5-325 MG PO TABS: 1.0000 | ORAL_TABLET | Freq: Four times a day (QID) | ORAL | Status: DC | PRN

## 2015-09-04 MED ORDER — AMOXICILLIN-POT CLAVULANATE 875-125 MG PO TABS
1.0000 | ORAL_TABLET | Freq: Two times a day (BID) | ORAL | Status: DC
  Administered 2015-09-04: 1 via ORAL
  Filled 2015-09-04 (×2): qty 1

## 2015-09-04 MED ORDER — AMOXICILLIN-POT CLAVULANATE 875-125 MG PO TABS: 1.0000 | ORAL_TABLET | Freq: Two times a day (BID) | ORAL | Status: DC

## 2015-09-04 MED ORDER — DOCUSATE SODIUM 100 MG PO CAPS: 100.0000 mg | ORAL_CAPSULE | Freq: Two times a day (BID) | ORAL | Status: DC | PRN

## 2015-09-04 MED ORDER — DOCUSATE SODIUM 100 MG PO CAPS
100.0000 mg | ORAL_CAPSULE | Freq: Two times a day (BID) | ORAL | Status: DC
  Administered 2015-09-04: 100 mg via ORAL
  Filled 2015-09-04: qty 1

## 2015-09-04 MED ORDER — HYDROCODONE-ACETAMINOPHEN 5-325 MG PO TABS: 1.0000 | ORAL_TABLET | ORAL | Status: DC | PRN

## 2015-09-04 NOTE — Discharge Summary (Signed)
  [facility] Discharge Summary   Patient ID: Adam Ruiz MRN: [id number] DOB/AGE: 1961-02-28 55 y.o.  Admit date: 09/01/2015 Discharge date: 09/04/2015  Admitting Diagnosis: Acute perforated appendicitis  Discharge Diagnosis Patient Active Problem List   Diagnosis Date Noted  . Appendicitis with perforation 09/01/2015    Consultants None  Imaging: No results found.  Procedures None this admission  Hospital Course:  55 y.o. male from [place] (non-english speaking) who presents with a 3 day history of worsening right lower quadrant pain. His family took him to an urgent care today and he was referred to the [facility] ER. CT scan obtained and reviewed by me shows a large phlegmon which with his history likely is a walled off perforated appendicitis and leukocytosis.  Patient was admitted and was transferred to the floor for non-operative management of his appendicitis.  There was concern he would need an ileocecectomy or right colectomy if we operated on him now so the plan was to watch him on IV antibiotics to see if he improved.  Each day his pain improved and his diet was eventually advanced to soft diet.  WBC normalized.  On HD #4, the patient was voiding well, tolerating diet, ambulating well, pain well controlled, vital signs stable, and felt stable for discharge home.  Patient will follow up in our office in 2 weeks and knows to call with questions or concerns.  He will call to confirm appointment date/time.  He will be discharged home on 14 days of antibiotics.        Medication List    STOP taking these medications        acetaminophen 500 MG tablet  Commonly known as:  TYLENOL      TAKE these medications        amoxicillin-clavulanate 875-125 MG tablet  Commonly known as:  AUGMENTIN  Take 1 tablet by mouth every 12 (twelve) hours.     docusate sodium 100 MG capsule  Commonly known as:  COLACE  Take 1 capsule (100 mg total) by mouth 2 (two) times daily as  needed for mild constipation.     HYDROcodone-acetaminophen 5-325 MG tablet  Commonly known as:  NORCO/VICODIN  Take 1-2 tablets by mouth every 6 (six) hours as needed for moderate pain or severe pain.     triamcinolone cream 0.1 %  Commonly known as:  KENALOG  Apply 1 application topically 2 (two) times daily. Do not use longer than 2 weeks.         Follow-up Information    Follow up with Luretha Murphy B, MD. Schedule an appointment as soon as possible for a visit in 2 weeks.   Specialty:  General Surgery   Why:  For post-hospital follow up.  Call to confirm his appointment date and time in 2-3 weeks from now.     Contact information:   [address] [place] [address] [phone]       Signed: Nonie Hoyer, [facility] [phone]  09/04/2015, 3:19 PM

## 2015-09-04 NOTE — Progress Notes (Signed)
RN reviewed discharge instructions with patient and family. RN reviewed follow up appointments with MD. All questions answered.   Aunt Victorino Dike) will be managing patients medications at home. Informed about antibiotic regimen, and pain medication.   NT walked with patient and family down to main entrance.

## 2015-09-04 NOTE — Progress Notes (Signed)
[facility] Progress Note     Subjective: Pt doing much better, no N/V, no pain.  Ambulating well.  Tolerating clear liquids.  Hungry.  Last BM ws 09/01/15.  Passing flatus.  Urinating well.  Friends at bedside.  Objective: Vital signs in last 24 hours: Temp:  [98 F (36.7 C)-99.1 F (37.3 C)] 98 F (36.7 C) (02/02 0606) Pulse Rate:  [57-74] 57 (02/02 0606) Resp:  [15-16] 15 (02/02 0606) BP: (104-143)/(66-77) 104/77 mmHg (02/02 0606) SpO2:  [96 %-100 %] 100 % (02/02 0606) Weight:  [59.5 kg (131 lb 2.8 oz)] 59.5 kg (131 lb 2.8 oz) (02/01 1300) Last BM Date: 09/01/15  Intake/Output from previous day: 02/01 0701 - 02/02 0700 In: 2055 [I.V.:2005; IV Piggyback:50] Out: -  Intake/Output this shift:    PE: Gen:  Alert, NAD, pleasant Abd: Soft, NT/ND, +BS, no HSM, no abdominal scars noted   Lab Results:   Recent Labs  09/02/15 0516 09/03/15 0515  WBC 8.7 7.2  HGB 13.0 13.2  HCT 40.9 41.4  PLT 232 236   BMET  Recent Labs  09/02/15 0516 09/03/15 0515  NA 138 137  K 3.9 3.8  CL 106 105  CO2 24 24  GLUCOSE 115* 122*  BUN 14 11  CREATININE 0.93 1.10  CALCIUM 8.6* 8.5*   PT/INR No results for input(s): LABPROT, INR in the last 72 hours. CMP     Component Value Date/Time   NA 137 09/03/2015 0515   K 3.8 09/03/2015 0515   CL 105 09/03/2015 0515   CO2 24 09/03/2015 0515   GLUCOSE 122* 09/03/2015 0515   BUN 11 09/03/2015 0515   CREATININE 1.10 09/03/2015 0515   CALCIUM 8.5* 09/03/2015 0515   PROT 7.7 09/01/2015 1408   ALBUMIN 4.1 09/01/2015 1408   AST 22 09/01/2015 1408   ALT 21 09/01/2015 1408   ALKPHOS 58 09/01/2015 1408   BILITOT 0.5 09/01/2015 1408   GFRNONAA >60 09/03/2015 0515   GFRAA >60 09/03/2015 0515   Lipase     Component Value Date/Time   LIPASE 24 09/01/2015 1408       Studies/Results: No results found.  Anti-infectives: Anti-infectives    Start     Dose/Rate Route Frequency Ordered Stop   09/02/15 0800   piperacillin-tazobactam (ZOSYN) IVPB 3.375 g     3.375 g 12.5 mL/hr over 240 Minutes Intravenous Every 8 hours 09/02/15 0711     09/02/15 0600  cefTRIAXone (ROCEPHIN) 2 g in dextrose 5 % 50 mL IVPB  Status:  Discontinued     2 g 100 mL/hr over 30 Minutes Intravenous Every 24 hours 09/02/15 0012 09/02/15 0711   09/02/15 0400  metroNIDAZOLE (FLAGYL) IVPB 500 mg  Status:  Discontinued     500 mg 100 mL/hr over 60 Minutes Intravenous Every 8 hours 09/02/15 0012 09/02/15 0711   09/01/15 1845  cefTRIAXone (ROCEPHIN) 1 g in dextrose 5 % 50 mL IVPB     1 g 100 mL/hr over 30 Minutes Intravenous  Once 09/01/15 1830 09/01/15 1932   09/01/15 1845  metroNIDAZOLE (FLAGYL) IVPB 500 mg     500 mg 100 mL/hr over 60 Minutes Intravenous  Once 09/01/15 1830 09/01/15 2040       Assessment/Plan Acute perforated appendicitis with phlegmon -Continue medical management as he's having some improvements, and if not improving in the next few days, may need ileocecectomy. -Rocephin/flagyl switched to Zosyn Day #2/2, will switch to Augmentin Day #1, WBC normal and afebrile -Fulls, then soft  at lunch, IVF, pain control, antiemetics -Ambulate and IS -SCD's and heparin -Repeat CT at some point, and bring back in 6-8 weeks for an interval appendectomy  Discussed his care the patient and his Buddhist monk friend in the room. His friend is able to translate for [place].   Maybe discharge today or tomorrow    LOS: 3 days    Nonie Hoyer 09/04/2015, 8:54 AM Pager: [phone]

## 2015-09-04 NOTE — Discharge Instructions (Signed)
Appendicitis Appendicitis is when the appendix is swollen (inflamed). The inflammation can lead to developing a hole (perforation) and a collection of pus (abscess). CAUSES  There is not always an obvious cause of appendicitis. Sometimes it is caused by an obstruction in the appendix. The obstruction can be caused by:  A small, hard, pea-sized ball of stool (fecalith).  Enlarged lymph glands in the appendix. SYMPTOMS   Pain around your belly button (navel) that moves toward your lower right belly (abdomen). The pain can become more severe and sharp as time passes.  Tenderness in the lower right abdomen. Pain gets worse if you cough or make a sudden movement.  Feeling sick to your stomach (nauseous).  Throwing up (vomiting).  Loss of appetite.  Fever.  Constipation.  Diarrhea.  Generally not feeling well. DIAGNOSIS   Physical exam.  Blood tests.  Urine test.  X-rays or a CT scan may confirm the diagnosis. TREATMENT  Once the diagnosis of appendicitis is made, the most common treatment is to remove the appendix as soon as possible. This procedure is called appendectomy. In an open appendectomy, a cut (incision) is made in the lower right abdomen and the appendix is removed. In a laparoscopic appendectomy, usually 3 small incisions are made. Long, thin instruments and a camera tube are used to remove the appendix. Most patients go home in 24 to 48 hours after appendectomy. In some situations, the appendix may have already perforated and an abscess may have formed. The abscess may have a "wall" around it as seen on a CT scan. In this case, a drain may be placed into the abscess to remove fluid, and you may be treated with antibiotic medicines that kill germs. The medicine is given through a tube in your vein (IV). Once the abscess has resolved, it may or may not be necessary to have an appendectomy. You may need to stay in the hospital longer than 48 hours.   This information is  not intended to replace advice given to you by your health care provider. Make sure you discuss any questions you have with your health care provider.   Document Released: 07/19/2005 Document Revised: 01/18/2012 Document Reviewed: 12/04/2014 Elsevier Interactive Patient Education 2016 [employer] Inc[employer]  

## 2015-09-17 ENCOUNTER — Telehealth (HOSPITAL_BASED_OUTPATIENT_CLINIC_OR_DEPARTMENT_OTHER): Payer: Self-pay | Admitting: Emergency Medicine

## 2015-09-19 ENCOUNTER — Other Ambulatory Visit: Payer: Self-pay | Admitting: General Surgery

## 2015-09-19 DIAGNOSIS — K3533 Acute appendicitis with perforation and localized peritonitis, with abscess: Secondary | ICD-10-CM

## 2015-10-15 ENCOUNTER — Ambulatory Visit
Admission: RE | Admit: 2015-10-15 | Discharge: 2015-10-15 | Disposition: A | Discharge status: Home / Self Care | Payer: BLUE CROSS/BLUE SHIELD | Source: Ambulatory Visit | Attending: General Surgery | Admitting: General Surgery

## 2015-10-15 DIAGNOSIS — K3533 Acute appendicitis with perforation and localized peritonitis, with abscess: Secondary | ICD-10-CM

## 2015-10-15 MED ORDER — IOPAMIDOL (ISOVUE-300) INJECTION 61%
100.0000 mL | Freq: Once | INTRAVENOUS | Status: AC | PRN
  Administered 2015-10-15: 100 mL via INTRAVENOUS

## 2015-11-05 ENCOUNTER — Ambulatory Visit: Payer: Self-pay | Admitting: Surgery

## 2015-11-26 NOTE — Progress Notes (Signed)
Spoke with Toniann FailWendy at CS/ nurse triage requesting her to notify Dr Daphine DeutscherMartin that we have left 4 messages utilizing the [place]ian interpretor for [employer] to call to set up pre op appt- calls started 11/18/15, 4/20,4/21,and 4/25.

## 2015-12-02 ENCOUNTER — Encounter (HOSPITAL_COMMUNITY): Payer: Self-pay | Admitting: *Deleted

## 2015-12-03 ENCOUNTER — Observation Stay (HOSPITAL_COMMUNITY)
Admission: RE | Admit: 2015-12-03 | Discharge: 2015-12-04 | Disposition: A | Discharge status: Home / Self Care | Payer: BLUE CROSS/BLUE SHIELD | Source: Ambulatory Visit | Attending: Surgery | Admitting: Surgery

## 2015-12-03 ENCOUNTER — Ambulatory Visit (HOSPITAL_COMMUNITY): Payer: BLUE CROSS/BLUE SHIELD | Admitting: Certified Registered"

## 2015-12-03 ENCOUNTER — Encounter (HOSPITAL_COMMUNITY): Payer: Self-pay | Admitting: *Deleted

## 2015-12-03 ENCOUNTER — Encounter (HOSPITAL_COMMUNITY)
Admission: RE | Disposition: A | Discharge status: Home / Self Care | Payer: Self-pay | Source: Ambulatory Visit | Attending: Surgery

## 2015-12-03 DIAGNOSIS — K388 Other specified diseases of appendix: Secondary | ICD-10-CM | POA: Diagnosis not present

## 2015-12-03 DIAGNOSIS — Z8719 Personal history of other diseases of the digestive system: Secondary | ICD-10-CM | POA: Diagnosis not present

## 2015-12-03 DIAGNOSIS — R1031 Right lower quadrant pain: Secondary | ICD-10-CM | POA: Diagnosis present

## 2015-12-03 DIAGNOSIS — Z9049 Acquired absence of other specified parts of digestive tract: Secondary | ICD-10-CM

## 2015-12-03 HISTORY — DX: Other specified health status: Z78.9

## 2015-12-03 HISTORY — PX: LAPAROSCOPIC APPENDECTOMY: SHX408

## 2015-12-03 LAB — CBC
HCT: 43.3 % (ref 39.0–52.0)
HCT: 42.3 % (ref 39.0–52.0)
HEMOGLOBIN: 14.4 g/dL (ref 13.0–17.0)
HEMOGLOBIN: 14 g/dL (ref 13.0–17.0)
MCH: 28.6 pg (ref 26.0–34.0)
MCH: 28.5 pg (ref 26.0–34.0)
MCHC: 33.3 g/dL (ref 30.0–36.0)
MCHC: 33.1 g/dL (ref 30.0–36.0)
MCV: 86.1 fL (ref 78.0–100.0)
MCV: 86.2 fL (ref 78.0–100.0)
PLATELETS: 195 10*3/uL (ref 150–400)
Platelets: 202 10*3/uL (ref 150–400)
RBC: 5.03 MIL/uL (ref 4.22–5.81)
RBC: 4.91 MIL/uL (ref 4.22–5.81)
RDW: 14 % (ref 11.5–15.5)
RDW: 14 % (ref 11.5–15.5)
WBC: 5.9 10*3/uL (ref 4.0–10.5)
WBC: 11.5 10*3/uL — AB (ref 4.0–10.5)

## 2015-12-03 LAB — CREATININE, SERUM
CREATININE: 0.9 mg/dL (ref 0.61–1.24)
GFR calc Af Amer: >60 mL/min (ref >60)

## 2015-12-03 SURGERY — APPENDECTOMY, LAPAROSCOPIC
Anesthesia: General

## 2015-12-03 MED ORDER — CHLORHEXIDINE GLUCONATE 4 % EX LIQD: 1.0000 "application " | Freq: Once | CUTANEOUS | Status: DC

## 2015-12-03 MED ORDER — CEFOTETAN DISODIUM-DEXTROSE 2-2.08 GM-% IV SOLR
INTRAVENOUS | Status: AC
  Filled 2015-12-03: qty 50

## 2015-12-03 MED ORDER — HEPARIN SODIUM (PORCINE) 5000 UNIT/ML IJ SOLN
5000.0000 [IU] | Freq: Once | INTRAMUSCULAR | Status: AC
  Administered 2015-12-03: 5000 [IU] via SUBCUTANEOUS
  Filled 2015-12-03: qty 1

## 2015-12-03 MED ORDER — SUGAMMADEX SODIUM 200 MG/2ML IV SOLN
INTRAVENOUS | Status: AC
  Filled 2015-12-03: qty 2

## 2015-12-03 MED ORDER — BUPIVACAINE LIPOSOME 1.3 % IJ SUSP
20.0000 mL | Freq: Once | INTRAMUSCULAR | Status: DC
  Filled 2015-12-03: qty 20

## 2015-12-03 MED ORDER — MIDAZOLAM HCL 5 MG/5ML IJ SOLN
INTRAMUSCULAR | Status: DC | PRN
  Administered 2015-12-03: 2 mg via INTRAVENOUS

## 2015-12-03 MED ORDER — DEXAMETHASONE SODIUM PHOSPHATE 10 MG/ML IJ SOLN
INTRAMUSCULAR | Status: AC
  Filled 2015-12-03: qty 1

## 2015-12-03 MED ORDER — HYDROCODONE-ACETAMINOPHEN 5-325 MG PO TABS
1.0000 | ORAL_TABLET | ORAL | Status: DC | PRN
  Administered 2015-12-03: 1 via ORAL
  Filled 2015-12-03: qty 1

## 2015-12-03 MED ORDER — BUPIVACAINE LIPOSOME 1.3 % IJ SUSP
INTRAMUSCULAR | Status: DC | PRN
Start: 2015-12-03 — End: 2015-12-03
  Administered 2015-12-03: 20 mL

## 2015-12-03 MED ORDER — EPHEDRINE SULFATE 50 MG/ML IJ SOLN
INTRAMUSCULAR | Status: AC
  Filled 2015-12-03: qty 1

## 2015-12-03 MED ORDER — ROCURONIUM BROMIDE 100 MG/10ML IV SOLN
INTRAVENOUS | Status: DC | PRN
  Administered 2015-12-03: 40 mg via INTRAVENOUS

## 2015-12-03 MED ORDER — MORPHINE SULFATE (PF) 2 MG/ML IV SOLN: 1.0000 mg | INTRAVENOUS | Status: DC | PRN

## 2015-12-03 MED ORDER — KCL IN DEXTROSE-NACL 20-5-0.45 MEQ/L-%-% IV SOLN
INTRAVENOUS | Status: DC
  Administered 2015-12-03: 100 mL/h via INTRAVENOUS
  Administered 2015-12-03: 15:00:00 via INTRAVENOUS
  Filled 2015-12-03 (×3): qty 1000

## 2015-12-03 MED ORDER — CEFOTETAN DISODIUM-DEXTROSE 2-2.08 GM-% IV SOLR
2.0000 g | INTRAVENOUS | Status: AC
  Administered 2015-12-03: 2 g via INTRAVENOUS

## 2015-12-03 MED ORDER — DEXAMETHASONE SODIUM PHOSPHATE 10 MG/ML IJ SOLN
INTRAMUSCULAR | Status: DC | PRN
  Administered 2015-12-03: 10 mg via INTRAVENOUS

## 2015-12-03 MED ORDER — PROPOFOL 10 MG/ML IV BOLUS
INTRAVENOUS | Status: DC | PRN
  Administered 2015-12-03: 120 mg via INTRAVENOUS

## 2015-12-03 MED ORDER — ONDANSETRON HCL 4 MG/2ML IJ SOLN
INTRAMUSCULAR | Status: AC
  Filled 2015-12-03: qty 4

## 2015-12-03 MED ORDER — LIDOCAINE HCL (CARDIAC) 20 MG/ML IV SOLN
INTRAVENOUS | Status: DC | PRN
  Administered 2015-12-03: 50 mg via INTRAVENOUS

## 2015-12-03 MED ORDER — MEPERIDINE HCL 50 MG/ML IJ SOLN: 6.2500 mg | INTRAMUSCULAR | Status: DC | PRN

## 2015-12-03 MED ORDER — FENTANYL CITRATE (PF) 100 MCG/2ML IJ SOLN: 25.0000 ug | INTRAMUSCULAR | Status: DC | PRN

## 2015-12-03 MED ORDER — FENTANYL CITRATE (PF) 100 MCG/2ML IJ SOLN
INTRAMUSCULAR | Status: DC | PRN
  Administered 2015-12-03: 150 ug via INTRAVENOUS

## 2015-12-03 MED ORDER — METOCLOPRAMIDE HCL 5 MG/ML IJ SOLN: 10.0000 mg | Freq: Once | INTRAMUSCULAR | Status: DC | PRN

## 2015-12-03 MED ORDER — 0.9 % SODIUM CHLORIDE (POUR BTL) OPTIME
TOPICAL | Status: DC | PRN
  Administered 2015-12-03: 1000 mL

## 2015-12-03 MED ORDER — LACTATED RINGERS IV SOLN
INTRAVENOUS | Status: DC
  Administered 2015-12-03: 1000 mL via INTRAVENOUS
  Administered 2015-12-03: 13:00:00 via INTRAVENOUS

## 2015-12-03 MED ORDER — EPHEDRINE SULFATE 50 MG/ML IJ SOLN
INTRAMUSCULAR | Status: DC | PRN
  Administered 2015-12-03: 10 mg via INTRAVENOUS
  Administered 2015-12-03: 5 mg via INTRAVENOUS

## 2015-12-03 MED ORDER — ONDANSETRON HCL 4 MG/2ML IJ SOLN: 4.0000 mg | Freq: Four times a day (QID) | INTRAMUSCULAR | Status: DC | PRN

## 2015-12-03 MED ORDER — FENTANYL CITRATE (PF) 250 MCG/5ML IJ SOLN
INTRAMUSCULAR | Status: AC
  Filled 2015-12-03: qty 5

## 2015-12-03 MED ORDER — PROPOFOL 10 MG/ML IV BOLUS
INTRAVENOUS | Status: AC
  Filled 2015-12-03: qty 20

## 2015-12-03 MED ORDER — LACTATED RINGERS IV SOLN: INTRAVENOUS | Status: DC

## 2015-12-03 MED ORDER — HEPARIN SODIUM (PORCINE) 5000 UNIT/ML IJ SOLN
5000.0000 [IU] | Freq: Three times a day (TID) | INTRAMUSCULAR | Status: DC
  Administered 2015-12-03 – 2015-12-04 (×2): 5000 [IU] via SUBCUTANEOUS
  Filled 2015-12-03 (×5): qty 1

## 2015-12-03 MED ORDER — ONDANSETRON HCL 4 MG/2ML IJ SOLN
INTRAMUSCULAR | Status: DC | PRN
  Administered 2015-12-03 (×4): 2 mg via INTRAVENOUS

## 2015-12-03 MED ORDER — MIDAZOLAM HCL 2 MG/2ML IJ SOLN
INTRAMUSCULAR | Status: AC
  Filled 2015-12-03: qty 2

## 2015-12-03 MED ORDER — SUGAMMADEX SODIUM 200 MG/2ML IV SOLN
INTRAVENOUS | Status: DC | PRN
  Administered 2015-12-03: 150 mg via INTRAVENOUS

## 2015-12-03 MED ORDER — ONDANSETRON 4 MG PO TBDP: 4.0000 mg | ORAL_TABLET | Freq: Four times a day (QID) | ORAL | Status: DC | PRN

## 2015-12-03 MED ORDER — LACTATED RINGERS IV SOLN
INTRAVENOUS | Status: DC | PRN
  Administered 2015-12-03: 1000 mL

## 2015-12-03 MED ORDER — ROCURONIUM BROMIDE 100 MG/10ML IV SOLN
INTRAVENOUS | Status: AC
  Filled 2015-12-03: qty 1

## 2015-12-03 SURGICAL SUPPLY — 34 items
APPLIER CLIP ROT 10 11.4 M/L (STAPLE)
CABLE HIGH FREQUENCY MONO STRZ (ELECTRODE) IMPLANT
CLIP APPLIE ROT 10 11.4 M/L (STAPLE) IMPLANT
COVER SURGICAL LIGHT HANDLE (MISCELLANEOUS) ×2 IMPLANT
CUTTER FLEX LINEAR 45M (STAPLE) ×2 IMPLANT
DECANTER SPIKE VIAL GLASS SM (MISCELLANEOUS) ×2 IMPLANT
DRAPE LAPAROSCOPIC ABDOMINAL (DRAPES) ×2 IMPLANT
ELECT REM PT RETURN 9FT ADLT (ELECTROSURGICAL) ×2
ELECTRODE REM PT RTRN 9FT ADLT (ELECTROSURGICAL) ×1 IMPLANT
ENDOLOOP SUT PDS II  0 18 (SUTURE)
ENDOLOOP SUT PDS II 0 18 (SUTURE) IMPLANT
GLOVE BIOGEL M 8.0 STRL (GLOVE) ×2 IMPLANT
GOWN STRL REUS W/TWL XL LVL3 (GOWN DISPOSABLE) ×4 IMPLANT
KIT BASIN OR (CUSTOM PROCEDURE TRAY) ×2 IMPLANT
LIQUID BAND (GAUZE/BANDAGES/DRESSINGS) ×2 IMPLANT
POUCH RETRIEVAL ECOSAC 10 (ENDOMECHANICALS) IMPLANT
POUCH RETRIEVAL ECOSAC 10MM (ENDOMECHANICALS)
POUCH SPECIMEN RETRIEVAL 10MM (ENDOMECHANICALS) IMPLANT
RELOAD 45 VASCULAR/THIN (ENDOMECHANICALS) ×4 IMPLANT
RELOAD STAPLE TA45 3.5 REG BLU (ENDOMECHANICALS) IMPLANT
SCISSORS LAP 5X45 EPIX DISP (ENDOMECHANICALS) IMPLANT
SCRUB PCMX 4 OZ (MISCELLANEOUS) ×2 IMPLANT
SET IRRIG TUBING LAPAROSCOPIC (IRRIGATION / IRRIGATOR) ×2 IMPLANT
SHEARS HARMONIC ACE PLUS 45CM (MISCELLANEOUS) ×2 IMPLANT
SLEEVE XCEL OPT CAN 5 100 (ENDOMECHANICALS) ×2 IMPLANT
STAPLER VISISTAT 35W (STAPLE) IMPLANT
SUT VIC AB 4-0 SH 18 (SUTURE) ×2 IMPLANT
SUT VICRYL 0 UR6 27IN ABS (SUTURE) ×2 IMPLANT
TOWEL OR 17X26 10 PK STRL BLUE (TOWEL DISPOSABLE) ×4 IMPLANT
TRAY FOLEY W/METER SILVER 14FR (SET/KITS/TRAYS/PACK) ×2 IMPLANT
TRAY LAPAROSCOPIC (CUSTOM PROCEDURE TRAY) ×2 IMPLANT
TROCAR BLADELESS OPT 5 100 (ENDOMECHANICALS) ×2 IMPLANT
TROCAR XCEL BLUNT TIP 100MML (ENDOMECHANICALS) ×2 IMPLANT
TROCAR XCEL NON-BLD 11X100MML (ENDOMECHANICALS) IMPLANT

## 2015-12-03 NOTE — Interval H&P Note (Signed)
History and Physical Interval Note:  12/03/2015 11:23 AM  Adam Ruiz  has presented today for surgery, with the diagnosis of Appendiceal phlegram   The various methods of treatment have been discussed with the patient and family. After consideration of risks, benefits and other options for treatment, the patient has consented to  Procedure(s): APPENDECTOMY LAPAROSCOPIC (N/A) as a surgical intervention .  The patient's history has been reviewed, patient examined, no change in status, stable for surgery.  I have reviewed the patient's chart and labs.  Questions were answered to the patient's satisfaction.     [name] B

## 2015-12-03 NOTE — Progress Notes (Signed)
Video interpreter used once pt awake. Aware of surgery finished and denies pain.

## 2015-12-03 NOTE — Anesthesia Procedure Notes (Signed)
Procedure Name: Intubation Date/Time: 12/03/2015 12:32 PM Performed by: Army FossaPULLIAM, Jamien Casanova DANE Pre-anesthesia Checklist: Patient identified, Emergency Drugs available, Suction available, Patient being monitored and Timeout performed Patient Re-evaluated:Patient Re-evaluated prior to inductionOxygen Delivery Method: Circle system utilized Preoxygenation: Pre-oxygenation with 100% oxygen Intubation Type: IV induction Ventilation: Mask ventilation without difficulty Laryngoscope Size: Miller and 2 Grade View: Grade III Tube type: Oral Tube size: 7.0 mm Number of attempts: 3 (Brad Coomer SRNA attempted DVL X 1 with Mac 3 and DVL X 1 with Miller 2) Airway Equipment and Method: Patient positioned with wedge pillow and Bougie stylet Placement Confirmation: ETT inserted through vocal cords under direct vision,  positive ETCO2,  CO2 detector and breath sounds checked- equal and bilateral Secured at: 21 cm Tube secured with: Tape Dental Injury: Teeth and Oropharynx as per pre-operative assessment

## 2015-12-03 NOTE — H&P (Signed)
Author: Johnathan Hausen, MD Service: Surgery Author Type: Physician    Filed: 09/01/2015 8:54 PM Note Time: 09/01/2015 8:50 PM Status: Signed   Editor: Johnathan Hausen, MD (Physician)     Expand All Collapse All   Chief Complaint: Right lower quadrant pain  History of Present Illness: Jakavion Bilodeau is an 55 y.o. male from [place] who presents with a 3 day history of worsening right lower quadrant pain. His family took him to an urgent care today and he was referred to the [facility] ER. CT scan obtained and reviewed by me shows a large phlegmon which with his history likely is a walled off perforated appendicitis.   History reviewed. No pertinent past medical history.  History reviewed. No pertinent past surgical history.  Current Facility-Administered Medications  Medication Dose Route Frequency Provider Last Rate Last Dose  . [START ON 09/02/2015] Influenza vac split quadrivalent PF (FLUARIX) injection 0.5 mL 0.5 mL Intramuscular Tomorrow-1000 Sharlett Iles, MD     Current Outpatient Prescriptions  Medication Sig Dispense Refill  . acetaminophen (TYLENOL) 500 MG tablet Take 500-1,000 mg by mouth every 6 (six) hours as needed for moderate pain or headache.     Review of patient's allergies indicates no known allergies. No family history on file. Social History:  reports that he has never smoked. He does not have any smokeless tobacco history on file. He reports that he does not drink alcohol or use illicit drugs.   REVIEW OF SYSTEMS : Negative   Physical Exam:  Blood pressure 133/79, pulse 77, temperature 98 F (36.7 C), temperature source Oral, resp. rate 18, SpO2 96 %. There is no weight on file to calculate BMI.  Gen: WDWN Asian male NAD  Neurological: Alert and oriented to person, place, and time. Motor and sensory function is grossly intact  Head: Normocephalic and atraumatic.  Eyes: Conjunctivae are normal. Pupils are equal, round, and  reactive to light. No scleral icterus.  Neck: Normal range of motion. Neck supple. No tracheal deviation or thyromegaly present.  Cardiovascular: SR without murmurs or gallops. No carotid bruits Breast: Not examined Respiratory: Effort normal. No respiratory distress. No chest wall tenderness. Breath sounds normal. No wheezes, rales or rhonchi.  Abdomen: Tender and guarding in the right lower quadrant GU: Male genitals Musculoskeletal: Normal range of motion. Extremities are nontender. No cyanosis, edema or clubbing noted Lymphadenopathy: No cervical, preauricular, postauricular or axillary adenopathy is present Skin: Skin is warm and dry. No rash noted. No diaphoresis. No erythema. No pallor. Pscyh: Normal mood and affect. Behavior is normal. Judgment and thought content normal.   LABORATORY RESULTS:  Lab Results Last 48 Hours    Results for orders placed or performed during the hospital encounter of 09/01/15 (from the past 48 hour(s))  Lipase, blood Status: None   Collection Time: 09/01/15 2:08 PM  Result Value Ref Range   Lipase 24 11 - 51 U/L  Comprehensive metabolic panel Status: Abnormal   Collection Time: 09/01/15 2:08 PM  Result Value Ref Range   Sodium 138 135 - 145 mmol/L   Potassium 4.3 3.5 - 5.1 mmol/L   Chloride 104 101 - 111 mmol/L   CO2 26 22 - 32 mmol/L   Glucose, Bld 99 65 - 99 mg/dL   BUN 18 6 - 20 mg/dL   Creatinine, Ser 0.84 0.61 - 1.24 mg/dL   Calcium 8.8 (L) 8.9 - 10.3 mg/dL   Total Protein 7.7 6.5 - 8.1 g/dL   Albumin 4.1 3.5 - 5.0  g/dL   AST 22 15 - 41 U/L   ALT 21 17 - 63 U/L   Alkaline Phosphatase 58 38 - 126 U/L   Total Bilirubin 0.5 0.3 - 1.2 mg/dL   GFR calc non Af Amer >60 >60 mL/min   GFR calc Af Amer >60 >60 mL/min    Comment: (NOTE) The eGFR has been calculated using the CKD EPI equation. This calculation has not been validated in all clinical  situations. eGFR's persistently <60 mL/min signify possible Chronic Kidney Disease.    Anion gap 8 5 - 15  CBC Status: Abnormal   Collection Time: 09/01/15 2:08 PM  Result Value Ref Range   WBC 11.8 (H) 4.0 - 10.5 K/uL   RBC 4.90 4.22 - 5.81 MIL/uL   Hemoglobin 13.9 13.0 - 17.0 g/dL   HCT 43.1 39.0 - 52.0 %   MCV 88.0 78.0 - 100.0 fL   MCH 28.4 26.0 - 34.0 pg   MCHC 32.3 30.0 - 36.0 g/dL   RDW 13.6 11.5 - 15.5 %   Platelets 239 150 - 400 K/uL  Differential Status: Abnormal   Collection Time: 09/01/15 2:08 PM  Result Value Ref Range   Neutrophils Relative % 77 %   Neutro Abs 8.9 (H) 1.7 - 7.7 K/uL   Lymphocytes Relative 14 %   Lymphs Abs 1.7 0.7 - 4.0 K/uL   Monocytes Relative 5 %   Monocytes Absolute 0.6 0.1 - 1.0 K/uL   Eosinophils Relative 4 %   Eosinophils Absolute 0.5 0.0 - 0.7 K/uL   Basophils Relative 0 %   Basophils Absolute 0.0 0.0 - 0.1 K/uL  Urinalysis, Routine w reflex microscopic (not at [facility]) Status: Abnormal   Collection Time: 09/01/15 2:54 PM  Result Value Ref Range   Color, Urine YELLOW YELLOW   APPearance CLOUDY (A) CLEAR   Specific Gravity, Urine 1.020 1.005 - 1.030   pH 7.5 5.0 - 8.0   Glucose, UA NEGATIVE NEGATIVE mg/dL   Hgb urine dipstick TRACE (A) NEGATIVE   Bilirubin Urine NEGATIVE NEGATIVE   Ketones, ur NEGATIVE NEGATIVE mg/dL   Protein, ur NEGATIVE NEGATIVE mg/dL   Nitrite NEGATIVE NEGATIVE   Leukocytes, UA NEGATIVE NEGATIVE  Urine microscopic-add on Status: Abnormal   Collection Time: 09/01/15 2:54 PM  Result Value Ref Range   Squamous Epithelial / LPF 0-5 (A) NONE SEEN   WBC, UA 0-5 0 - 5 WBC/hpf   RBC / HPF 0-5 0 - 5 RBC/hpf   Bacteria, UA NONE SEEN NONE SEEN   Urine-Other AMORPHOUS URATES/PHOSPHATES        RADIOLOGY RESULTS:  Imaging Results  (Last 48 hours)    Dg Abd 1 View  09/01/2015 CLINICAL DATA: cc of abdominal pain for 3 days. This is at the central abdominal pain that moves around. The pain is constant. He has never had this pain before. EXAM: ABDOMEN - 1 VIEW COMPARISON: None. FINDINGS: Gas within normal caliber large and small bowel. No abnormal abdominal calcifications. No appendicolith. No significant air-fluid levels or evidence of free intraperitoneal air. IMPRESSION: No acute findings. Electronically Signed By: Abigail Miyamoto M.D. On: 09/01/2015 12:44   Ct Abdomen Pelvis W Contrast  09/01/2015 CLINICAL DATA: 55 year old presenting with 3 day history of right lower quadrant abdominal pain and tenderness. Mild leukocytosis with white blood count 10200. EXAM: CT ABDOMEN AND PELVIS WITH CONTRAST TECHNIQUE: Multidetector CT imaging of the abdomen and pelvis was performed using the standard protocol following bolus administration of intravenous contrast. CONTRAST: 132m OMNIPAQUE  IOHEXOL 300 MG/ML IV. Oral contrast was also administered. COMPARISON: None. FINDINGS: Respiratory motion blurred images throughout the examination, but a diagnostic study was obtained. Lower chest: Heart size upper normal to slightly enlarged. Visualized lung bases clear. Hepatobiliary: Liver normal in size and appearance. Gallbladder normal in appearance without calcified gallstones. No biliary ductal dilation. Pancreas: Normal in appearance without evidence of mass, ductal dilation, or inflammation. Spleen: Normal in size and appearance. Adrenals/Urinary Tract: Normal appearing adrenal glands. High attenuation 8 mm cyst involving the upper pole of the left kidney. No solid renal masses. No urinary tract calculi. No hydronephrosis. Normal-appearing decompressed urinary bladder. Stomach/Bowel: Phlegmonous change/inflammatory mass at the base of the cecum. The appendix is difficult to identify within this inflammatory mass. Mild thickening of the wall of  the distal and terminal ileum adjacent to the inflammatory mass. Small bowel otherwise normal in appearance. Stomach normal in appearance for the degree of distention. Solitary diverticulum arising from the distal descending colon without evidence of acute diverticulitis. Remainder of the colon unremarkable. No ascites. Vascular/Lymphatic: Mild aortoiliac atherosclerosis without aneurysm. Patent visceral arteries. No pathologic lymphadenopathy. Reproductive: Moderate to marked median lobe prostate gland enlargement. Prominent seminal vesicles without focal mass. Other: None. 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 No acute abnormality. IMPRESSION: 1. Severe acute appendicitis with phlegmonous change/inflammatory mass. No extraluminal gas to confirm perforation. The appendix is difficult to identify within the inflammatory mass. No evidence of abscess currently. 2. Secondary mild inflammation of the distal and terminal ileum. 3. 8 mm likely hemorrhagic or proteinaceous cyst involving the upper pole of the left kidney. 4. Moderate to marked median lobe prostate gland enlargement for age. I telephoned these results at the time of interpretation on 09/01/2015 at 6:27 pm to [facility] CAMPRU[facility], [place], who verbally acknowledged these results. Electronically Signed By: Evangeline Dakin M.D. On: 09/01/2015 18:29     Problem List: Patient Active Problem List   Diagnosis Date Noted  . Appendicitis with perforation 09/01/2015    Assessment & Plan: Appendiceal phlegmon. Rocephin and Flagyl given and monitored as an outpatient.  Returns for lap/open appendectomy  Matt B. Hassell Done, MD, [facility]

## 2015-12-03 NOTE — Anesthesia Postprocedure Evaluation (Signed)
Anesthesia Post Note  Patient: Adam Ruiz  Procedure(s) Performed: Procedure(s) (LRB): APPENDECTOMY LAPAROSCOPIC (N/A)  Patient location during evaluation: PACU Anesthesia Type: General Level of consciousness: awake and alert Pain management: pain level controlled Vital Signs Assessment: post-procedure vital signs reviewed and stable Respiratory status: spontaneous breathing, nonlabored ventilation, respiratory function stable and patient connected to nasal cannula oxygen Cardiovascular status: blood pressure returned to baseline and stable Postop Assessment: no signs of nausea or vomiting Anesthetic complications: no    Last Vitals:  Filed Vitals:   12/03/15 1515 12/03/15 1615  BP: 128/86 136/86  Pulse: 64 72  Temp: 36.7 C 36.5 C  Resp: 16 18    Last Pain:  Filed Vitals:   12/03/15 1618  PainSc: 0-No pain                 Phillips Groutarignan, Orena Cavazos

## 2015-12-03 NOTE — Transfer of Care (Signed)
Immediate Anesthesia Transfer of Care Note  Patient: Adam HeaterSam Balthaser  Procedure(s) Performed: Procedure(s): APPENDECTOMY LAPAROSCOPIC (N/A)  Patient Location: PACU  Anesthesia Type:General  Level of Consciousness:  sedated, patient cooperative and responds to stimulation  Airway & Oxygen Therapy:Patient Spontanous Breathing and Patient connected to face mask oxgen  Post-op Assessment:  Report given to PACU RN and Post -op Vital signs reviewed and stable  Post vital signs:  Reviewed and stable  Last Vitals:  Filed Vitals:   12/03/15 0945 12/03/15 1316  BP: 149/93 149/90  Pulse: 58 72  Temp: 36.6 C 36.6 C  Resp: 16 12    Complications: No apparent anesthesia complications

## 2015-12-03 NOTE — Anesthesia Preprocedure Evaluation (Signed)
Anesthesia Evaluation  Patient identified by MRN, date of birth, ID band Patient awake    Reviewed: Allergy & Precautions, NPO status , Patient's Chart, lab work & pertinent test results  Airway Mallampati: II  TM Distance: >3 FB Neck ROM: Full    Dental no notable dental hx. (+) Caps   Pulmonary neg pulmonary ROS,    Pulmonary exam normal breath sounds clear to auscultation       Cardiovascular negative cardio ROS Normal cardiovascular exam Rhythm:Regular Rate:Normal     Neuro/Psych negative neurological ROS  negative psych ROS   GI/Hepatic negative GI ROS, Neg liver ROS,   Endo/Other  negative endocrine ROS  Renal/GU negative Renal ROS  negative genitourinary   Musculoskeletal negative musculoskeletal ROS (+)   Abdominal   Peds negative pediatric ROS (+)  Hematology negative hematology ROS (+)   Anesthesia Other Findings   Reproductive/Obstetrics negative OB ROS                             Anesthesia Physical Anesthesia Plan  ASA: I  Anesthesia Plan: General   Post-op Pain Management:    Induction: Intravenous  Airway Management Planned: Oral ETT  Additional Equipment:   Intra-op Plan:   Post-operative Plan: Extubation in OR  Informed Consent: I have reviewed the patients History and Physical, chart, labs and discussed the procedure including the risks, benefits and alternatives for the proposed anesthesia with the patient or authorized representative who has indicated his/her understanding and acceptance.   Dental advisory given  Plan Discussed with: CRNA  Anesthesia Plan Comments:         Anesthesia Quick Evaluation

## 2015-12-03 NOTE — Op Note (Signed)
Surgeon: Wenda LowMatt Arletha Marschke, MD, FACS  Asst:  none  Anes:  general  Procedure: Laparoscopic appendectomy (interval)  Diagnosis: Hx of appendiceal phlegmon  Complications: none  EBL:   5 cc  Drains: none  Description of Procedure:  [facility]  After anesthesia was administered and the patient was prepped a timeout was performed.  Access achieved with a Hasson technique through the umbilicus.  Two five mm trocars were placed in the right upper and left lower quadrant.  The appendix tip was grasped and elevated and the adhesions to the base were divided sharply.  The base was isolated and the appendix and a rim of cecum was excised with two applications of the Endo GIA 4.5 mm stapler with vascular load.  No bleeding was noted.  The appendix was placed into a bag and removed.  The cecum was irrigated and inspected and the base closure appeared secure.  [facility] defect was repaired with a single 0 vicryl.  The abdomen was deflated after the [facility] site was inspected endoscopically.  Port sites were injected with Exparel and closed with 4-0 vicryl and Liquiban.    The patient tolerated the procedure well and was taken to the PACU in stable condition.     Matt B. Daphine DeutscherMartin, MD, [facility] [place] [phone]

## 2015-12-04 DIAGNOSIS — K388 Other specified diseases of appendix: Secondary | ICD-10-CM | POA: Diagnosis not present

## 2015-12-04 DIAGNOSIS — Z9049 Acquired absence of other specified parts of digestive tract: Secondary | ICD-10-CM

## 2015-12-04 MED ORDER — HYDROCODONE-ACETAMINOPHEN 5-325 MG PO TABS: 1.0000 | ORAL_TABLET | ORAL | Status: DC | PRN

## 2015-12-04 NOTE — Discharge Instructions (Signed)
Laparoscopic Appendectomy, Adult °Appendectomy is surgery to remove the appendix. Laparoscopic surgery uses several small cuts (incisions) instead of one large incision. Laparoscopic surgery offers a shorter recovery time and less discomfort. °LET YOUR CAREGIVER KNOW ABOUT: °· Allergies to food or medicine. °· Medicines taken, including vitamins, dietary supplements, herbs, eyedrops, over-the-counter medicines, and creams. °· Use of steroids (by mouth or creams). °· Previous problems with anesthetics or numbing medicines. °· History of bleeding problems or blood clots. °· Previous surgery. °· Other health problems, including diabetes, heart problems, lung problems, and kidney problems. °· Possibility of pregnancy, if this applies. °RISKS AND COMPLICATIONS °· Infection. A germ starts growing in the wound. This can usually be treated with antibiotics. In some cases, the wound will need to be opened and cleaned. °· Bleeding. °· Damage to other organs. °· Sores (abscesses). °· Chronic pain at the incision sites. This is defined as pain that lasts for more than 3 months. °· Blood clots in the legs that may rarely travel to the lungs. °· Infection in the lungs (pneumonia). °BEFORE THE PROCEDURE °Appendectomy is usually performed immediately after an inflamed appendix (appendicitis) is diagnosed. No preparation is necessary ahead of this procedure. °PROCEDURE  °You will be given medicine that makes you sleep (general anesthetic). After you are asleep, a flexible tube (catheter) may be inserted into your bladder to drain your urine during surgery. The tube is removed before you wake up after surgery. When you are asleep, carbon dioxide gas will be used to inflate your abdomen. This will allow your surgeon to see inside your abdomen and perform your surgery. Three small incisions will be made in your abdomen. Your surgeon will insert a thin, lighted tube (laparoscope) through one of the incisions. Your surgeon will look  through the laparoscope while performing the surgery. Other tools will be inserted through the other incisions. Laparoscopic procedures may not be appropriate when: °· There is major scarring from a previous surgery. °· The patient has bleeding disorders. °· A pregnancy is near term. °· There are other conditions which make the laparoscopic procedure impossible, such as an advanced infection or a ruptured appendix. °If your surgeon feels it is not safe to continue with the laparoscopic procedure, he or she will perform an open surgery instead. This gives the surgeon a larger view and more space to work. Open surgery requires a longer recovery time. After your appendix is removed, your incisions will be closed with stitches (sutures) or skin adhesive. °AFTER THE PROCEDURE °You will be taken to a recovery room. When the anesthesia has worn off, you will be returned to your hospital room. You will be given pain medicines to keep you comfortable. Ask your caregiver how long your hospital stay will be. °  °This information is not intended to replace advice given to you by your health care provider. Make sure you discuss any questions you have with your health care provider. °  °Document Released: 03/02/2004 Document Revised: 08/09/2014 Document Reviewed: 01/06/2015 °Elsevier Interactive Patient Education ©2016 Else[employer] Inc. ° °

## 2015-12-04 NOTE — Discharge Summary (Signed)
Physician Discharge Summary  Patient ID: Adam Ruiz MRN: [id number] DOB/AGE: [birth year] 55 y.o.  Admit date: 12/03/2015 Discharge date: 12/04/2015  Admission Diagnoses:  History of perforated appendicitis  Discharge Diagnoses:  same  Active Problems:   Status post laparoscopic appendectomy   Surgery:  Interval laparoscopic appendectomy  Discharged Condition: improved  Hospital Course:   Had surgery and kept overnight.  Taking regular diet without pain.  Consults: none  Significant Diagnostic Studies: none    Discharge Exam: Blood pressure 99/64, pulse 75, temperature 98.4 F (36.9 C), temperature source Oral, resp. rate 17, height 5\' 3"  (1.6 m), weight 60.442 kg (133 lb 4 oz), SpO2 97 %. Incisions OK.  No pain and good appetite.  Ready for discharge.  Handwritten note given to return to work at [employer] in a week.   Disposition: 01-Home or Self Care  Discharge Instructions    Diet general    Complete by:  As directed   Advance diet at tolerated     Discharge wound care:    Complete by:  As directed   May shower and get incisions wet     Increase activity slowly    Complete by:  As directed             Medication List    TAKE these medications        amoxicillin-clavulanate 875-125 MG tablet  Commonly known as:  AUGMENTIN  Take 1 tablet by mouth every 12 (twelve) hours.     docusate sodium 100 MG capsule  Commonly known as:  COLACE  Take 1 capsule (100 mg total) by mouth 2 (two) times daily as needed for mild constipation.     HYDROcodone-acetaminophen 5-325 MG tablet  Commonly known as:  NORCO/VICODIN  Take 1-2 tablets by mouth every 6 (six) hours as needed for moderate pain or severe pain.     HYDROcodone-acetaminophen 5-325 MG tablet  Commonly known as:  NORCO/VICODIN  Take 1-2 tablets by mouth every 4 (four) hours as needed for moderate pain.     pseudoephedrine-acetaminophen 30-500 MG Tabs tablet  Commonly known as:  TYLENOL SINUS  Take 1 tablet  by mouth every 4 (four) hours as needed (for cold symptoms).     triamcinolone cream 0.1 %  Commonly known as:  KENALOG  Apply 1 application topically 2 (two) times daily. Do not use longer than 2 weeks.           Follow-up Information    Follow up with Adam Ruiz,Adam Arreola B, MD. Schedule an appointment as soon as possible for a visit in 3 weeks.   Specialty:  General Surgery   Contact information:   [address] [place] [id number] [phone]       Signed: [name],Adam Ruiz 12/04/2015, 8:40 AM

## 2017-02-01 IMAGING — CT CT ABD-PELV W/ CM
1 of 3 series · 13 of 32 positions shown, 18 images · IV contrast (APPLIED)
Comparison: CT abdomen pelvis of 09/01/2015

CLINICAL DATA: History appendicitis with abscess, followup after
antibiotic treatment

EXAM:
CT ABDOMEN AND PELVIS WITH CONTRAST
TECHNIQUE: Multidetector CT imaging of the abdomen and pelvis was performed
using the standard protocol following bolus administration of
intravenous contrast.
CONTRAST:  100mL RCQY3R-0VV IOPAMIDOL (RCQY3R-0VV) INJECTION 61%

[Series 2: abd/pelvis w/cm · axial · 0.62mm/px · z∈[-418,-4]mm · 13 of 93 slices shown, 18 images]
[im 5/93  soft-tissue]
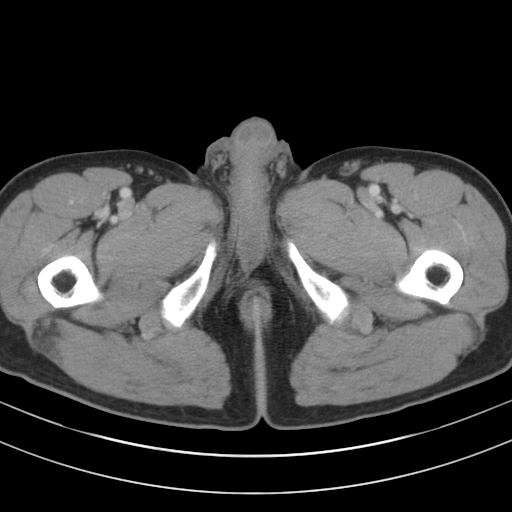
[im 5/93  bone]
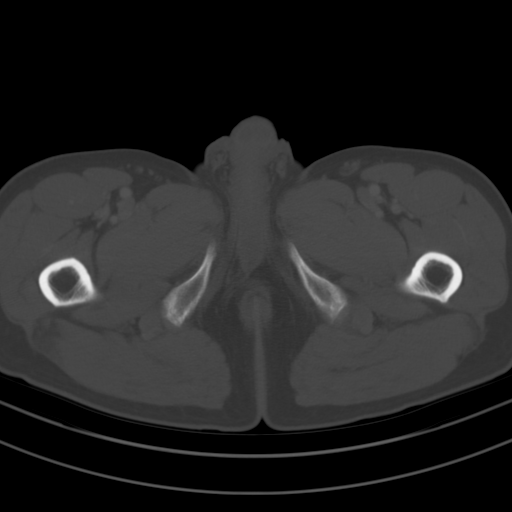
[im 15/93  soft-tissue]
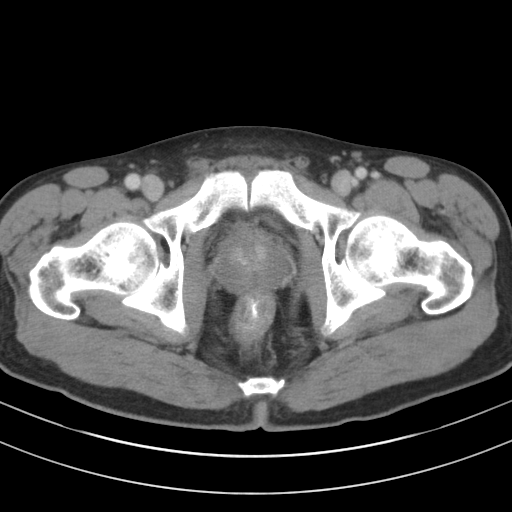
[im 20/93  soft-tissue]
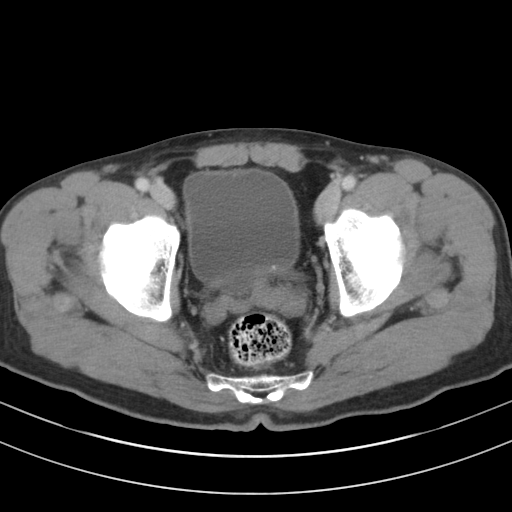
[im 30/93  soft-tissue]
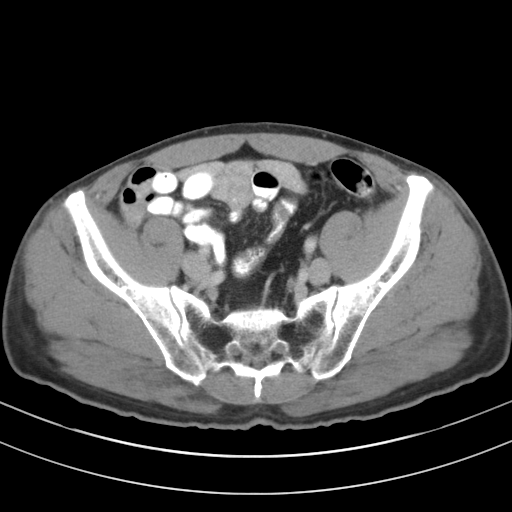
[im 34/93  soft-tissue]
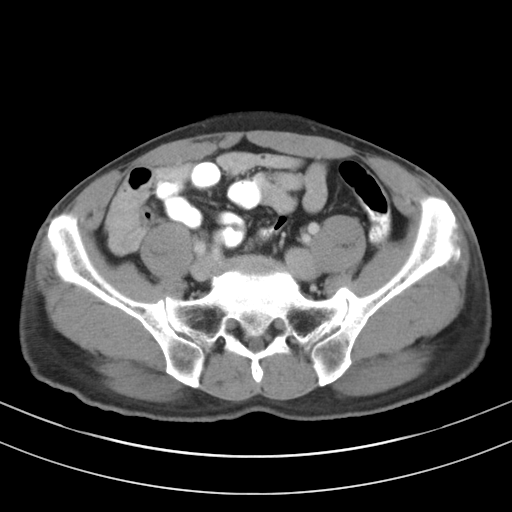
[im 44/93  soft-tissue]
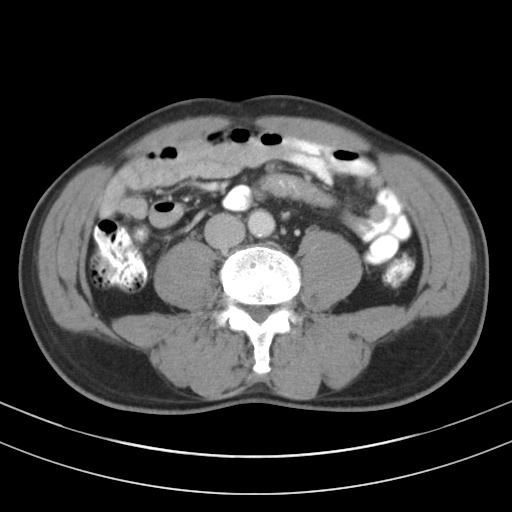
[im 49/93  soft-tissue]
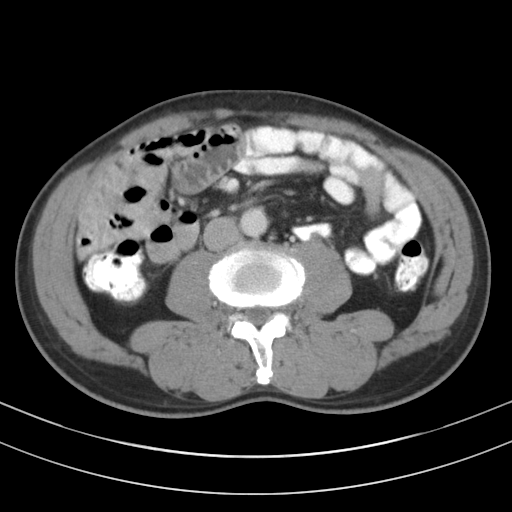
[im 59/93  soft-tissue]
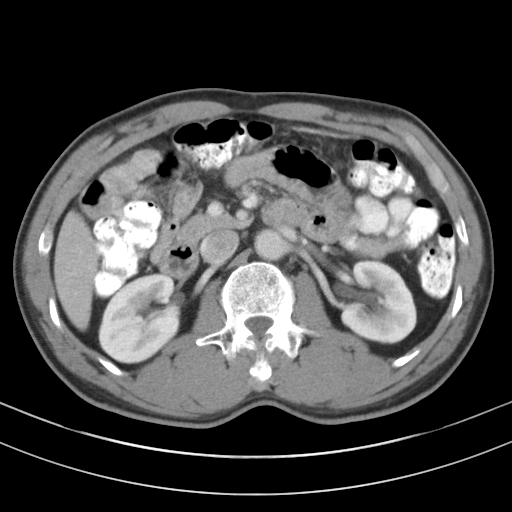
[im 63/93  soft-tissue]
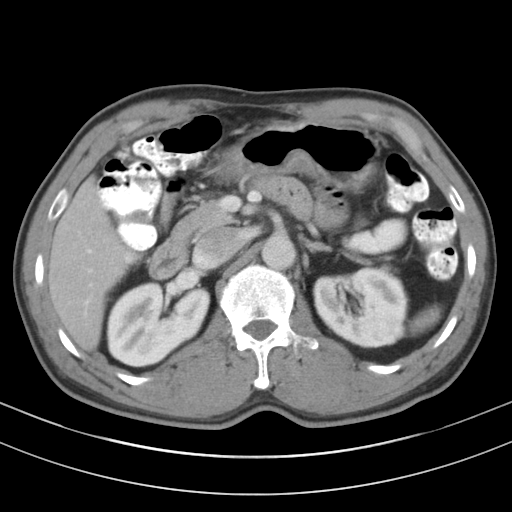
[im 63/93  bone]
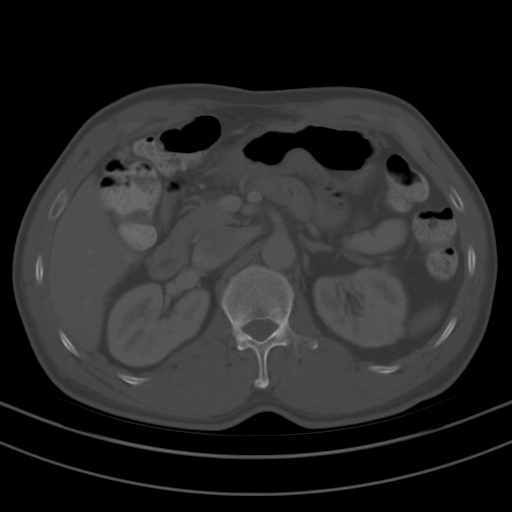
[im 73/93  soft-tissue]
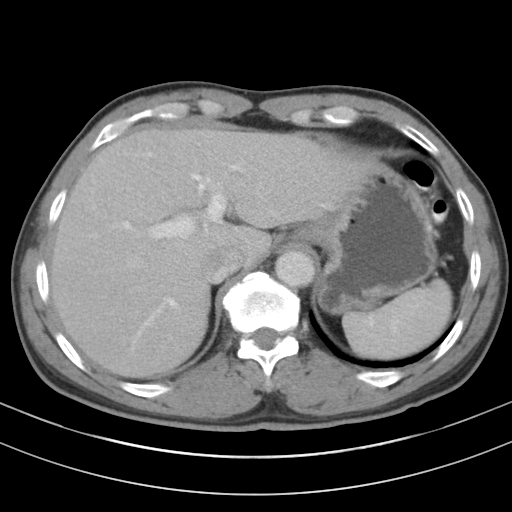
[im 73/93  lung]
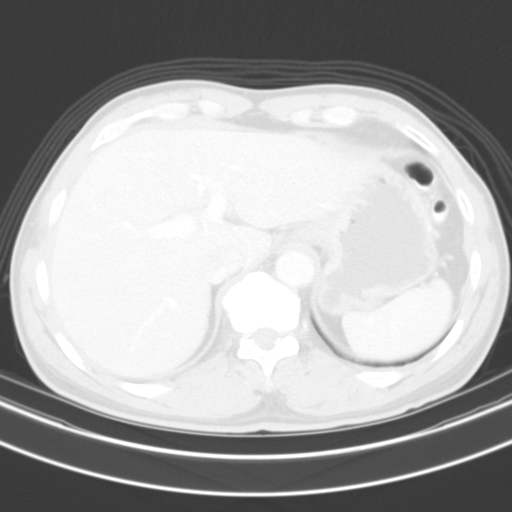
[im 78/93  soft-tissue]
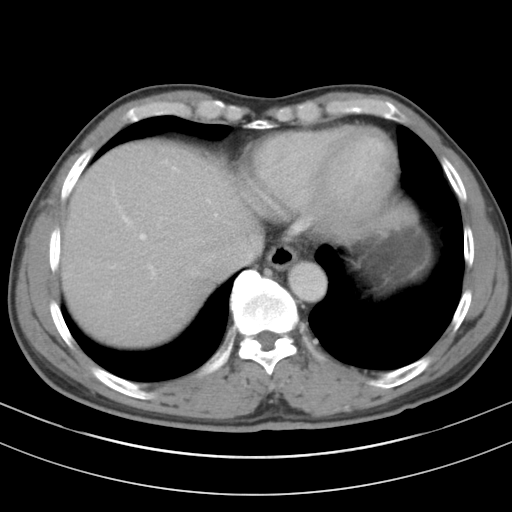
[im 78/93  lung]
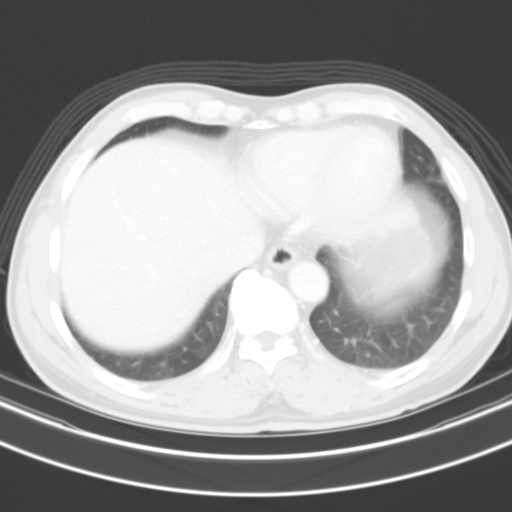
[im 83/93  lung]
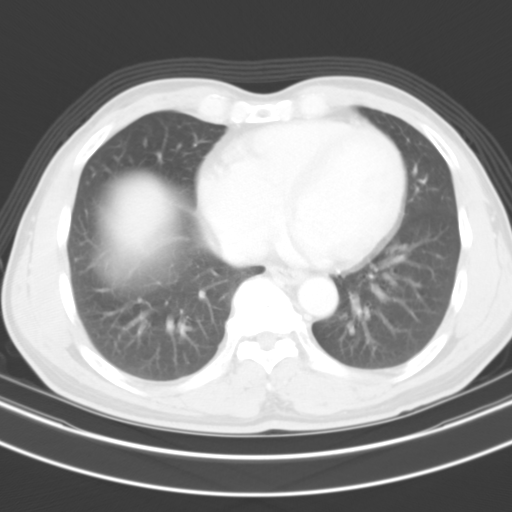
[im 88/93  soft-tissue]
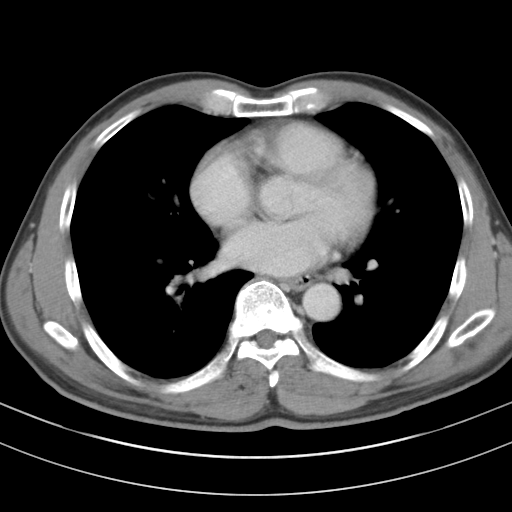
[im 88/93  lung]
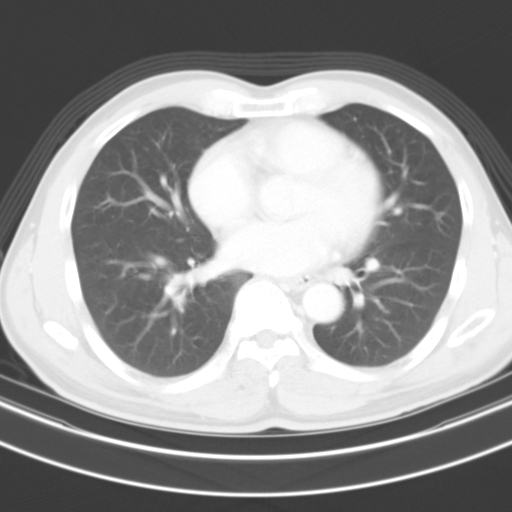

[13 of 32 positions shown; findings below may reference images not displayed]

FINDINGS: The lung bases are clear. Some calcification is noted then in the
distribution of the left anterior descending coronary artery. The
liver enhances with no focal abnormality and no ductal dilatation is
seen. No calcified gallstones are noted. The pancreas is normal in
size and the pancreatic duct is not dilated. The adrenal glands are
unremarkable. There appears be gastric fundal diverticulum extending
posteriorly and medially which is a variation of normal. The spleen
is normal in size. A small splenule is noted medially. The kidneys
enhance with no calculus or mass, and there is no change in a small
cyst in the upper pole of the left kidney. On delayed images, the
pelvocaliceal systems are unremarkable and the proximal ureters are
normal in caliber. The abdominal aorta is normal in caliber. No
adenopathy is seen.

The urinary bladder is unremarkable. The prostate is somewhat
prominent measuring 3.1 x 4.5 cm and appearing inhomogeneous. The
inflammatory phlegmon type appearance of the right lower quadrant
has markedly improved. There does appear to be some air entering the
appendix, and no present inflammatory process is seen. The
appendiceal wall may be slightly prominent possibly due to residual
edema. The terminal ileum is unremarkable. The lumbar vertebrae are
in normal alignment. There is degenerative change of the facet
joints of the lower lumbar spine.
IMPRESSION: 1. Significant improvement in the appearance of the inflamed
appendix within the right lower quadrant with air now filling the
lumen of the appendix. There is still some thickening of the
appendiceal wall but no surrounding inflammatory process is evident.
2. Calcification in the distribution of the left anterior descending
coronary artery.

## 2017-10-24 ENCOUNTER — Other Ambulatory Visit: Payer: Self-pay

## 2017-10-24 ENCOUNTER — Ambulatory Visit (INDEPENDENT_AMBULATORY_CARE_PROVIDER_SITE_OTHER): Payer: BLUE CROSS/BLUE SHIELD

## 2017-10-24 ENCOUNTER — Ambulatory Visit: Payer: BLUE CROSS/BLUE SHIELD | Admitting: Physician Assistant

## 2017-10-24 ENCOUNTER — Encounter: Payer: Self-pay | Admitting: Physician Assistant

## 2017-10-24 VITALS — BP 164/95 | HR 61 | Temp 97.8°F | Resp 16 | Ht 63.0 in | Wt 140.4 lb

## 2017-10-24 DIAGNOSIS — M25561 Pain in right knee: Secondary | ICD-10-CM

## 2017-10-24 DIAGNOSIS — G8929 Other chronic pain: Secondary | ICD-10-CM

## 2017-10-24 DIAGNOSIS — M79671 Pain in right foot: Secondary | ICD-10-CM

## 2017-10-24 DIAGNOSIS — M25571 Pain in right ankle and joints of right foot: Secondary | ICD-10-CM | POA: Diagnosis not present

## 2017-10-24 MED ORDER — MELOXICAM 15 MG PO TABS: 15.0000 mg | ORAL_TABLET | Freq: Every day | ORAL | 0 refills | Status: DC

## 2017-10-24 NOTE — Progress Notes (Signed)
PRIMARY CARE AT [facility] [address], [place] [address] [phone]  Date:  10/24/2017   Name:  Adam Ruiz   DOB:  07/18/1961   MRN:  [id number]  PCP:  Patient, No Pcp Per    History of Present Illness:  Adam Ruiz is a 57 y.o. male patient who presents to PCP with  Chief Complaint  Patient presents with  . right leg pain x few months    pt is taking advil for pain and it helps some. Pain level 6/10.  Pain in ankle and knee of right leg and big toe.     interpreter utilized at this visit.  Patient reports knee inner area to ankle to toe for several months.  He has had no swelling.  He tripped and fell at a side walk, 3 years ago.  He is taking over the counter Advil which helps very little.  400 mg bid.    Patient Active Problem List   Diagnosis Date Noted  . Status post laparoscopic appendectomy 12/04/2015    Past Medical History:  Diagnosis Date  . Medical history non-contributory     Past Surgical History:  Procedure Laterality Date  . LAPAROSCOPIC APPENDECTOMY N/A 12/03/2015   Procedure: APPENDECTOMY LAPAROSCOPIC;  Surgeon: Luretha Murphy, MD;  Location: [facility];  Service: General;  Laterality: N/A;  . NO PAST SURGERIES      Social History   Tobacco Use  . Smoking status: Never Smoker  . Smokeless tobacco: Never Used  Substance Use Topics  . Alcohol use: No    Alcohol/week: 0.0 oz  . Drug use: No    History reviewed. No pertinent family history.  No Known Allergies  Medication list has been reviewed and updated.  Current Outpatient Medications on File Prior to Visit  Medication Sig Dispense Refill  . ibuprofen (ADVIL,MOTRIN) 200 MG tablet Take 200 mg by mouth every 6 (six) hours as needed.    Marland Kitchen amoxicillin-clavulanate (AUGMENTIN) 875-125 MG tablet Take 1 tablet by mouth every 12 (twelve) hours. (Patient not taking: Reported on 12/03/2015) 28 tablet 0  . docusate sodium (COLACE) 100 MG capsule Take 1 capsule (100 mg total) by mouth 2 (two) times daily as  needed for mild constipation. (Patient not taking: Reported on 12/03/2015) 10 capsule 0  . HYDROcodone-acetaminophen (NORCO/VICODIN) 5-325 MG tablet Take 1-2 tablets by mouth every 6 (six) hours as needed for moderate pain or severe pain. (Patient not taking: Reported on 12/03/2015) 30 tablet 0  . HYDROcodone-acetaminophen (NORCO/VICODIN) 5-325 MG tablet Take 1-2 tablets by mouth every 4 (four) hours as needed for moderate pain. (Patient not taking: Reported on 10/24/2017) 30 tablet 0  . pseudoephedrine-acetaminophen (TYLENOL SINUS) 30-500 MG TABS tablet Take 1 tablet by mouth every 4 (four) hours as needed (for cold symptoms).    . triamcinolone cream (KENALOG) 0.1 % Apply 1 application topically 2 (two) times daily. Do not use longer than 2 weeks. (Patient not taking: Reported on 12/03/2015) 30 g 0   No current facility-administered medications on file prior to visit.     ROS ROS otherwise unremarkable unless listed above.  Physical Examination: BP (!) 164/95 (BP Location: Right Arm, Patient Position: Sitting, Cuff Size: Normal)   Pulse 61   Temp 97.8 F (36.6 C) (Oral)   Resp 16   Ht 5\' 3"  (1.6 m)   Wt 140 lb 6.4 oz (63.7 kg)   SpO2 99%   BMI 24.87 kg/m  Ideal Body Weight: Weight in (lb) to have BMI =  25: 140.8  Physical Exam  Constitutional: He is oriented to person, place, and time. He appears well-developed and well-nourished. No distress.  HENT:  Head: Atraumatic.  Right Ear: Tympanic membrane, external ear and ear canal normal.  Left Ear: Tympanic membrane, external ear and ear canal normal.  Nose: Mucosal edema and rhinorrhea present. Right sinus exhibits no maxillary sinus tenderness and no frontal sinus tenderness. Left sinus exhibits no maxillary sinus tenderness and no frontal sinus tenderness.  Mouth/Throat: No uvula swelling. No oropharyngeal exudate, posterior oropharyngeal edema or posterior oropharyngeal erythema.  Eyes: Pupils are equal, round, and reactive to light.  Conjunctivae, EOM and lids are normal. Right eye exhibits normal extraocular motion. Left eye exhibits normal extraocular motion.  Neck: Trachea normal and full passive range of motion without pain. No edema and no erythema present.  Cardiovascular: Normal rate.  Pulmonary/Chest: Effort normal. No respiratory distress. He has no decreased breath sounds. He has no wheezes. He has no rhonchi.  Musculoskeletal:       Right knee: He exhibits MCL laxity. He exhibits normal range of motion, no swelling and no bony tenderness. Tenderness found. Medial joint line tenderness noted.       Right ankle: He exhibits normal range of motion, no swelling and no ecchymosis. Tenderness. Medial malleolus (just inferior to the base of the malleolus) tenderness found. Achilles tendon normal.  Neurological: He is alert and oriented to person, place, and time.  Skin: Skin is warm and dry. He is not diaphoretic.  Psychiatric: He has a normal mood and affect. His behavior is normal.    Dg Ankle Complete Right  Result Date: 10/24/2017 CLINICAL DATA:  Medial right ankle tenderness for several months after inversion injury. EXAM: RIGHT ANKLE - COMPLETE 3+ VIEW; RIGHT FOOT COMPLETE - 3+ VIEW COMPARISON:  None. FINDINGS: There is no evidence of fracture, dislocation, or joint effusion. There is no evidence of arthropathy or other focal bone abnormality. Soft tissues are unremarkable. IMPRESSION: Negative. Electronically Signed   By: Marnee SpringJonathon  Watts M.D.   On: 10/24/2017 09:33   Dg Foot Complete Right  Result Date: 10/24/2017 CLINICAL DATA:  Medial right ankle tenderness for several months after inversion injury. EXAM: RIGHT ANKLE - COMPLETE 3+ VIEW; RIGHT FOOT COMPLETE - 3+ VIEW COMPARISON:  None. FINDINGS: There is no evidence of fracture, dislocation, or joint effusion. There is no evidence of arthropathy or other focal bone abnormality. Soft tissues are unremarkable. IMPRESSION: Negative. Electronically Signed   By: Marnee SpringJonathon   Watts M.D.   On: 10/24/2017 09:33    Assessment and Plan: Adam HeaterSam Ruiz is a 57 y.o. male who is here today for cc of  Chief Complaint  Patient presents with  . right leg pain x few months    pt is taking advil for pain and it helps some. Pain level 6/10.  Pain in ankle and knee of right leg and big toe.  advised stretches, and icing.  Given meloxicam with precautions.   tele translator utilized at this time.  Offered brace to help with mobilization.  He has declined the brace at this time.  Follow up in 2 weeks for follow up.  May consider referral for orthopedist. Chronic pain of right ankle - Plan: DG Ankle Complete Right, DG Foot Complete Right  Chronic pain of right knee - Plan: DG Ankle Complete Right, DG Foot Complete Right, meloxicam (MOBIC) 15 MG tablet  Right foot pain  Trena PlattStephanie English, PA-C [facility]  3/26/20199:57 AM

## 2017-10-24 NOTE — Patient Instructions (Addendum)
Ice the knee three times per day for 15 minutes.  I would like you to take meloxicam.  Do not take naproxen or ibuprofen with this medication.  You can take tylenol.   If this is not better in 2 weeks, please return.  We may need to send you to orthopedic specialist.    IF you received an x-ray today, you will receive an invoice from [facility]. Please contact [facility] at [phone] with questions or concerns regarding your invoice.   IF you received labwork today, you will receive an invoice from [facility]. Please contact [facility] at [phone] with questions or concerns regarding your invoice.   Our billing staff will not be able to assist you with questions regarding bills from these companies.  You will be contacted with the lab results as soon as they are available. The fastest way to get your results is to activate your My Chart account. Instructions are located on the last page of this paperwork. If you have not heard from us regarding the results in 2 weeks, please contact this office.

## 2017-11-09 ENCOUNTER — Encounter: Payer: Self-pay | Admitting: Physician Assistant

## 2018-04-17 ENCOUNTER — Other Ambulatory Visit: Payer: Self-pay

## 2018-04-17 ENCOUNTER — Ambulatory Visit (INDEPENDENT_AMBULATORY_CARE_PROVIDER_SITE_OTHER): Payer: BLUE CROSS/BLUE SHIELD

## 2018-04-17 ENCOUNTER — Encounter: Payer: Self-pay | Admitting: Family Medicine

## 2018-04-17 ENCOUNTER — Ambulatory Visit: Payer: BLUE CROSS/BLUE SHIELD | Admitting: Family Medicine

## 2018-04-17 VITALS — BP 120/72 | HR 54 | Temp 97.8°F | Resp 16 | Ht 62.21 in | Wt 140.0 lb

## 2018-04-17 DIAGNOSIS — K5900 Constipation, unspecified: Secondary | ICD-10-CM | POA: Diagnosis not present

## 2018-04-17 DIAGNOSIS — R1032 Left lower quadrant pain: Secondary | ICD-10-CM | POA: Diagnosis not present

## 2018-04-17 LAB — POCT CBC
GRANULOCYTE PERCENT: 61.1 % (ref 37–80)
HCT, POC: 42.7 % — AB (ref 43.5–53.7)
HEMOGLOBIN: 14.1 g/dL (ref 14.1–18.1)
Lymph, poc: 2.1 (ref 0.6–3.4)
MCH, POC: 28.8 pg (ref 27–31.2)
MCHC: 33 g/dL (ref 31.8–35.4)
MCV: 87.3 fL (ref 80–97)
MID (cbc): 0.7 (ref 0–0.9)
MPV: 5.9 fL (ref 0–99.8)
PLATELET COUNT, POC: 315 10*3/uL (ref 142–424)
POC Granulocyte: 4.3 (ref 2–6.9)
POC LYMPH %: 29.4 % (ref 10–50)
POC MID %: 9.5 %M (ref 0–12)
RBC: 4.89 M/uL (ref 4.69–6.13)
RDW, POC: 14.9 %
WBC: 7 10*3/uL (ref 4.6–10.2)

## 2018-04-17 NOTE — Patient Instructions (Addendum)
Miralax twice per day until normal bowel movement. Stop if diarrhea occurs. See info below. If pain not improving in next 2 days, return for recheck. Sooner if worse.   To bn, Ng??i l?n Constipation, Adult To bn l khi m?t ng??i ?i ??i ti?n trong m?t tu?n t h?n so v?i bnh th??ng, ??i ti?n kh kh?n, ho?c ??i ti?n ra phn kh, c?ng, ho?c to h?n bnh th??ng. To bn c th? do m?t tnh tr?ng bn trong gy ra. N c th? tr? ln tr?m tr?ng h?n theo ?? tu?i n?u m?t ng??i dng cc lo?i thu?c nh?t ??nh v khng u?ng ?? n??c. Tun th? nh?ng h??ng d?n ny ? nh: ?n v u?ng   ?n th?c ?n c nhi?u ch?t x? nh? tri cy t??i v rau, ng? c?c nguyn h?t v cc lo?i ??u.  H?n ch? th?c ?n c nhi?u ch?t bo, t ch?t x?, ho?c ch? bi?n s?n qu m?c, ch?ng h?n khoai ty chin, bnh hamburger, bnh quy, k?o v soda.  U?ng ?? n??c ?? gi? cho n??c ti?u trong ho?c c mu vng nh?t. H??ng d?n chung  T?p th? d?c th??ng xuyn theo ch? d?n c?a chuyn gia ch?m [place] s?c kh?e.  ?i v? sinh ngay khi qu v? c nhu c?u. Khng nh?n ?i ??i ti?n.  Ch? s? d?ng thu?c khng k ??n v thu?c k ??n theo ch? d?n c?a chuyn gia ch?m [place] s?c kh?e. Cc thu?c ny bao g?m b?t k? th?c ph?m ch?c n?ng c ch?t x? no.  Th?c hnh cc bi t?p luy?n l?i c? ?y ch?u, ch?ng h?n nh? th? su trong lc th? gin b?ng d??i v th? gin ph?n ?y ch?u trong khi ??i ti?n.  Theo di tnh tr?ng c?a qu v? ?? pht hi?n b?t k? thay ??i no.  Tun th? t?t c? cc l?n khm theo di theo ch? d?n c?a chuyn gia ch?m [place] s?c kh?e. ?i?u ny c vai tr quan tr?ng. Hy lin l?c v?i chuyn gia ch?m [place] s?c kh?e n?u:  Qu v? b? ?au tr?m tr?ng h?n.  Qu v? b? s?t.  Qu v? khng ?i ??i ti?n sau 4 ngy.  Qu v? nn.  Qu v? khng ?i.  Qu v? b? s?t cn.  Qu v? b? ch?y mu ? h?u mn.  Phn c?a qu v? m?ng, trng nh? bt ch. Yu c?u tr? gip ngay l?p t?c n?u:  Qu v? b? s?t v cc tri?u ch?ng c?a qu v? ??t nhin tr?m tr?ng h?n.  Qu v? sn phn ho?c  c mu trong phn.  B?ng c?a qu v? b? ch??ng ln.  Qu v? b? ?au r?t nhi?u ? b?ng.  Qu v? c?m th?y chng m?t ho?c ng?t x?u. Thng tin ny khng nh?m m?c ?ch thay th? cho l?i khuyn m chuyn gia ch?m [place] s?c kh?e ni v?i qu v?. Hy b?o ??m qu v? ph?i th?o lu?n b?t k? v?n ?? g m qu v? c v?i chuyn gia ch?m  s?c kh?e c?a qu v?. Document Released: 11/03/2010 Document Revised: 11/01/2016 Document Reviewed: 01/07/2016 Elsevier Interactive Patient Education  2018 Elsevier Inc.  ?au b?ng, Ng??i l?n Abdominal Pain, Adult Nhi?u nguyn nhn c th? gy ra ?au b?ng. ?au b?ng th??ng khng nghim tr?ng v ?? m khng c?n ph?i ?i?u tr? ho?c khi ???c ?i?u tr? t?i nh. Tuy nhin, ?i khi ?au b?ng l nghim tr?ng. Chuyn gia ch?m  s?c kh?e c?a qu v? s? khai thc b?nh s? v khm th?c th? ?? xc ??nh  nguyn nhn gy ra ?au b?ng. Tun th? nh?ng h??ng d?n ny ? nh:  Ch? s? d?ng thu?c khng k ??n v thu?c k ??n theo ch? d?n c?a chuyn gia ch?m [place] s?c kh?e. Khng dng thu?c nhu?n trng tr? khi ???c chuyn gia ch?m [place] s?c kh?e c?a qu v? ch? d?n.  U?ng ?? n??c ?? gi? cho n??c ti?u trong ho?c c mu vng nh?t.  Theo di tnh tr?ng c?a qu v? ?? pht hi?n b?t k? thay ??i no.  Tun th? t?t c? cc cu?c h?n khm l?i theo ch? d?n c?a chuyn gia ch?m [place] s?c kh?e. ?i?u ny c vai tr quan tr?ng. Hy lin l?c v?i chuyn gia ch?m [place] s?c kh?e n?u:  Tnh tr?ng ?au b?ng c?a qu v? thay ??i ho?c tr?m tr?ng h?n.  Qu v? khng ?i ho?c qu v? b? s?t cn m khng c? g?ng gi?m cn.  Qu v? b? to bn ho?c b? tiu ch?y qu 2-3 ngy.  Qu v? b? ?au khi ?i ti?u ho?c ?i ngoi.  ?au b?ng khi?n qu v? t?nh gi?c vo ban ?m.  ?au tr? nn tr?m tr?ng h?n khi ?n, sau khi ?n, ho?c khi ?n m?t s? th?c ph?m nh?t ??nh.  Qu v? nn v khng th? km l?i ???c.  Qu v? b? s?t. Yu c?u tr? gip ngay l?p t?c n?u:  C?n ?au c?a qu v? khng h?t s?m theo nh? d? ki?n c?a chuyn gia ch?m [place] s?c kh?e c?a qu  v?.  Qu v? v?n ti?p t?c b? nn.  Ch? c?m th?y ?au ? cc vng c?a b?ng, ch?ng h?n nh? ? ph?n b?ng d??i bn ph?i ho?c bn tri.  Quy? vi? ?i ngoi phn c ma?u ho??c phn ma?u ?en, ho??c phn trng nh? h??c i?n.  Qu v? b? ?au r?t nhi?u, co th?t ho??c ch???ng b?ng.  Qu v? c cc d?u hi?u m?t n??c, ch?ng h?n nh?: ? N??c ti?u s?m mu, r?t t n??c ti?u, ho?c khng c n??c ti?u. ? Mi n?t n?. ? Mi?ng kh. ? M?t tr?ng. ? Bu?n ng?. ? Y?u. Thng tin ny khng nh?m m?c ?ch thay th? cho l?i khuyn m chuyn gia ch?m [place] s?c kh?e ni v?i qu v?. Hy b?o ??m qu v? ph?i th?o lu?n b?t k? v?n ?? g m qu v? c v?i chuyn gia ch?m [place] s?c kh?e c?a qu v?. Document Released: 07/19/2005 Document Revised: 07/08/2016 Document Reviewed: 12/31/2015 Elsevier Interactive Patient Education  [employer]2018 Elsevier Inc.    If you have lab work done today you will be contacted with your lab results within the next 2 weeks.  If you have not heard from us then please contact us. The fastest way to get your results is to register for My Chart.   IF you received an x-ray today, you will receive an invoice from [facility]. Please contact [facility] at [phone] with questions or concerns regarding your invoice.   IF you received labwork today, you will receive an invoice from [facility]. Please contact [facility] at [phone] with questions or concerns regarding your invoice.   Our billing staff will not be able to assist you with questions regarding bills from these companies.  You will be contacted with the lab results as soon as they are available. The fastest way to get your results is to activate your My Chart account. Instructions are located on the last page of this paperwork. If you have not heard from us regarding the results in 2 weeks,  please contact this office.

## 2018-04-17 NOTE — Progress Notes (Signed)
Subjective:  By signing my name below, I, Essence Howell, attest that this documentation has been prepared under the direction and in the presence of Shade Flood, MD Electronically Signed: Charline Bills, ED Scribe 04/17/2018 at 9:00 AM.   Patient ID: Adam Ruiz, male    DOB: November 20, 1960, 57 y.o.   MRN: [id number]  Chief Complaint  Patient presents with  . Abdominal Pain    left side x 2 days   HPI Adam Ruiz is a 57 y.o. male who presents to Primary Care at [facility] complaining of L sided abdomina pain x 2 days. H/o appendectomy in 12/2015, prev perforated appendicitis with abscess. Pt's aunt is present to translate; pt speaks [place]  Today, pt presents with L-sided abdominal pain. He reports associated constipation x 1 wk. Last BM was yesterday but states it was very small amount. He has tried Tylenol for pain but no stool softeners. Denies fever, nausea, vomiting, difficulty urinating, dysuria, urinary frequency, hematuria. Pt has not had a colonoscopy.  Patient Active Problem List   Diagnosis Date Noted  . Status post laparoscopic appendectomy 12/04/2015   Past Medical History:  Diagnosis Date  . Medical history non-contributory    Past Surgical History:  Procedure Laterality Date  . LAPAROSCOPIC APPENDECTOMY N/A 12/03/2015   Procedure: APPENDECTOMY LAPAROSCOPIC;  Surgeon: Luretha Murphy, MD;  Location: [facility];  Service: General;  Laterality: N/A;  . NO PAST SURGERIES     No Known Allergies Prior to Admission medications   Medication Sig Start Date End Date Taking? Authorizing Provider  amoxicillin-clavulanate (AUGMENTIN) 875-125 MG tablet Take 1 tablet by mouth every 12 (twelve) hours. Patient not taking: Reported on 12/03/2015 09/04/15   Nonie Hoyer, PA-C  docusate sodium (COLACE) 100 MG capsule Take 1 capsule (100 mg total) by mouth 2 (two) times daily as needed for mild constipation. Patient not taking: Reported on 12/03/2015 09/04/15   Nonie Hoyer, PA-C    HYDROcodone-acetaminophen (NORCO/VICODIN) 5-325 MG tablet Take 1-2 tablets by mouth every 6 (six) hours as needed for moderate pain or severe pain. Patient not taking: Reported on 12/03/2015 09/04/15   Nonie Hoyer, PA-C  HYDROcodone-acetaminophen (NORCO/VICODIN) 5-325 MG tablet Take 1-2 tablets by mouth every 4 (four) hours as needed for moderate pain. Patient not taking: Reported on 10/24/2017 12/04/15   Luretha Murphy, MD  ibuprofen (ADVIL,MOTRIN) 200 MG tablet Take 200 mg by mouth every 6 (six) hours as needed.    [provider]  meloxicam (MOBIC) 15 MG tablet Take 1 tablet (15 mg total) by mouth daily. 10/24/17   Trena Platt D, PA  pseudoephedrine-acetaminophen (TYLENOL SINUS) 30-500 MG TABS tablet Take 1 tablet by mouth every 4 (four) hours as needed (for cold symptoms).    [provider]  triamcinolone cream (KENALOG) 0.1 % Apply 1 application topically 2 (two) times daily. Do not use longer than 2 weeks. Patient not taking: Reported on 12/03/2015 09/02/15   Garnetta Buddy, PA   Social History   Socioeconomic History  . Marital status: Single    Spouse name: Not on file  . Number of children: Not on file  . Years of education: Not on file  . Highest education level: Not on file  Occupational History  . Not on file  Social Needs  . Financial resource strain: Not on file  . Food insecurity:    Worry: Not on file    Inability: Not on file  . Transportation needs:    Medical: Not on  file    Non-medical: Not on file  Tobacco Use  . Smoking status: Never Smoker  . Smokeless tobacco: Never Used  Substance and Sexual Activity  . Alcohol use: No    Alcohol/week: 0.0 standard drinks  . Drug use: No  . Sexual activity: Never    Birth control/protection: Abstinence  Lifestyle  . Physical activity:    Days per week: Not on file    Minutes per session: Not on file  . Stress: Not on file  Relationships  . Social connections:    Talks on phone: Not on  file    Gets together: Not on file    Attends religious service: Not on file    Active member of club or organization: Not on file    Attends meetings of clubs or organizations: Not on file    Relationship status: Not on file  . Intimate partner violence:    Fear of current or ex partner: Not on file    Emotionally abused: Not on file    Physically abused: Not on file    Forced sexual activity: Not on file  Other Topics Concern  . Not on file  Social History Narrative  . Not on file    Review of Systems  Constitutional: Negative for fever.  Gastrointestinal: Positive for abdominal pain and constipation. Negative for nausea and vomiting.  Genitourinary: Negative for difficulty urinating, dysuria, frequency and hematuria.      Objective:   Physical Exam  Constitutional: He is oriented to person, place, and time. He appears well-developed and well-nourished. No distress.  HENT:  Head: Normocephalic and atraumatic.  Eyes: Conjunctivae and EOM are normal.  Neck: Neck supple. No tracheal deviation present.  Cardiovascular: Normal rate.  Pulmonary/Chest: Effort normal. No respiratory distress.  Abdominal: There is tenderness in the left lower quadrant. There is negative Murphy's sign.  Musculoskeletal: Normal range of motion.  Neurological: He is alert and oriented to person, place, and time.  Skin: Skin is warm and dry.  Psychiatric: He has a normal mood and affect. His behavior is normal.  Nursing note and vitals reviewed.  Vitals:   04/17/18 0854  BP: 120/72  Pulse: (!) 54  Resp: 16  Temp: 97.8 F (36.6 C)  TempSrc: Oral  SpO2: 99%  Weight: 140 lb (63.5 kg)  Height: 5' 2.21" (1.58 m)   Dg Abd 1 View  Result Date: 04/17/2018 CLINICAL DATA:  Left lower abdominal pain, suspected constipation, rule out nephrolithiasis EXAM: ABDOMEN - 1 VIEW COMPARISON:  CT 10/15/2015 FINDINGS: Stomach is nondilated. Small bowel decompressed. Moderate proximal colonic fecal material  without dilatation, decompressed distally. No abnormal abdominal calcifications. Mild endplate spurring in the lumbar spine. IMPRESSION: Nonobstructive bowel gas pattern with moderate proximal colonic fecal material. Electronically Signed   By: Corlis Leak M.D.   On: 04/17/2018 09:26   Results for orders placed or performed in visit on 04/17/18  POCT CBC  Result Value Ref Range   WBC 7.0 4.6 - 10.2 K/uL   Lymph, poc 2.1 0.6 - 3.4   POC LYMPH PERCENT 29.4 10 - 50 %L   MID (cbc) 0.7 0 - 0.9   POC MID % 9.5 0 - 12 %M   POC Granulocyte 4.3 2 - 6.9   Granulocyte percent 61.1 37 - 80 %G   RBC 4.89 4.69 - 6.13 M/uL   Hemoglobin 14.1 14.1 - 18.1 g/dL   HCT, POC 29.5 (A) 62.1 - 53.7 %   MCV 87.3 80 -  97 fL   MCH, POC 28.8 27 - 31.2 pg   MCHC 33.0 31.8 - 35.4 g/dL   RDW, POC 16.114.9 %   Platelet Count, POC 315 142 - 424 K/uL   MPV 5.9 0 - 99.8 fL      Assessment & Plan:   Adam HeaterSam Visscher is a 57 y.o. male LLQ abdominal pain - Plan: POCT CBC, DG Abd 1 View  Constipation, unspecified constipation type  Reassuring CBC, recent constipation by history and increased stool on XR.   -handout on constipation  - miralax OTC BID for now.   -Unlikely diverticulitis or infectious cause, but RTC/ER precautions given if worsening, or not improved in next 2 days.   No orders of the defined types were placed in this encounter.  Patient Instructions   Miralax twice per day until normal bowel movement. Stop if diarrhea occurs. See info below. If pain not improving in next 2 days, return for recheck. Sooner if worse.    To bn, Ng??i l?n Constipation, Adult To bn l khi m?t ng??i ?i ??i ti?n trong m?t tu?n t h?n so v?i bnh th??ng, ??i ti?n kh kh?n, ho?c ??i ti?n ra phn kh, c?ng, ho?c to h?n bnh th??ng. To bn c th? do m?t tnh tr?ng bn trong gy ra. N c th? tr? ln tr?m tr?ng h?n theo ?? tu?i n?u m?t ng??i dng cc lo?i thu?c nh?t ??nh v khng u?ng ?? n??c. Tun th? nh?ng h??ng d?n ny ? nh: ?n  v u?ng   ?n th?c ?n c nhi?u ch?t x? nh? tri cy t??i v rau, ng? c?c nguyn h?t v cc lo?i ??u.  H?n ch? th?c ?n c nhi?u ch?t bo, t ch?t x?, ho?c ch? bi?n s?n qu m?c, ch?ng h?n khoai ty chin, bnh hamburger, bnh quy, k?o v soda.  U?ng ?? n??c ?? gi? cho n??c ti?u trong ho?c c mu vng nh?t. H??ng d?n chung  T?p th? d?c th??ng xuyn theo ch? d?n c?a chuyn gia ch?m [place] s?c kh?e.  ?i v? sinh ngay khi qu v? c nhu c?u. Khng nh?n ?i ??i ti?n.  Ch? s? d?ng thu?c khng k ??n v thu?c k ??n theo ch? d?n c?a chuyn gia ch?m [place] s?c kh?e. Cc thu?c ny bao g?m b?t k? th?c ph?m ch?c n?ng c ch?t x? no.  Th?c hnh cc bi t?p luy?n l?i c? ?y ch?u, ch?ng h?n nh? th? su trong lc th? gin b?ng d??i v th? gin ph?n ?y ch?u trong khi ??i ti?n.  Theo di tnh tr?ng c?a qu v? ?? pht hi?n b?t k? thay ??i no.  Tun th? t?t c? cc l?n khm theo di theo ch? d?n c?a chuyn gia ch?m [place] s?c kh?e. ?i?u ny c vai tr quan tr?ng. Hy lin l?c v?i chuyn gia ch?m [place] s?c kh?e n?u:  Qu v? b? ?au tr?m tr?ng h?n.  Qu v? b? s?t.  Qu v? khng ?i ??i ti?n sau 4 ngy.  Qu v? nn.  Qu v? khng ?i.  Qu v? b? s?t cn.  Qu v? b? ch?y mu ? h?u mn.  Phn c?a qu v? m?ng, trng nh? bt ch. Yu c?u tr? gip ngay l?p t?c n?u:  Qu v? b? s?t v cc tri?u ch?ng c?a qu v? ??t nhin tr?m tr?ng h?n.  Qu v? sn phn ho?c c mu trong phn.  B?ng c?a qu v? b? ch??ng ln.  Qu v? b? ?au r?t nhi?u ? b?ng.  Qu v? c?m th?y chng  m?t ho?c ng?t x?u. Thng tin ny khng nh?m m?c ?ch thay th? cho l?i khuyn m chuyn gia ch?m [place] s?c kh?e ni v?i qu v?. Hy b?o ??m qu v? ph?i th?o lu?n b?t k? v?n ?? g m qu v? c v?i chuyn gia ch?m [place] s?c kh?e c?a qu v?. Document Released: 11/03/2010 Document Revised: 11/01/2016 Document Reviewed: 01/07/2016 Elsevier Interactive Patient Education  2018 Elsevier Inc.  ?au b?ng, Ng??i l?n Abdominal Pain, Adult Nhi?u nguyn nhn c th?  gy ra ?au b?ng. ?au b?ng th??ng khng nghim tr?ng v ?? m khng c?n ph?i ?i?u tr? ho?c khi ???c ?i?u tr? t?i nh. Tuy nhin, ?i khi ?au b?ng l nghim tr?ng. Chuyn gia ch?m [place] s?c kh?e c?a qu v? s? khai thc b?nh s? v khm th?c th? ?? xc ??nh nguyn nhn gy ra ?au b?ng. Tun th? nh?ng h??ng d?n ny ? nh:  Ch? s? d?ng thu?c khng k ??n v thu?c k ??n theo ch? d?n c?a chuyn gia ch?m [place] s?c kh?e. Khng dng thu?c nhu?n trng tr? khi ???c chuyn gia ch?m [place] s?c kh?e c?a qu v? ch? d?n.  U?ng ?? n??c ?? gi? cho n??c ti?u trong ho?c c mu vng nh?t.  Theo di tnh tr?ng c?a qu v? ?? pht hi?n b?t k? thay ??i no.  Tun th? t?t c? cc cu?c h?n khm l?i theo ch? d?n c?a chuyn gia ch?m [place] s?c kh?e. ?i?u ny c vai tr quan tr?ng. Hy lin l?c v?i chuyn gia ch?m [place] s?c kh?e n?u:  Tnh tr?ng ?au b?ng c?a qu v? thay ??i ho?c tr?m tr?ng h?n.  Qu v? khng ?i ho?c qu v? b? s?t cn m khng c? g?ng gi?m cn.  Qu v? b? to bn ho?c b? tiu ch?y qu 2-3 ngy.  Qu v? b? ?au khi ?i ti?u ho?c ?i ngoi.  ?au b?ng khi?n qu v? t?nh gi?c vo ban ?m.  ?au tr? nn tr?m tr?ng h?n khi ?n, sau khi ?n, ho?c khi ?n m?t s? th?c ph?m nh?t ??nh.  Qu v? nn v khng th? km l?i ???c.  Qu v? b? s?t. Yu c?u tr? gip ngay l?p t?c n?u:  C?n ?au c?a qu v? khng h?t s?m theo nh? d? ki?n c?a chuyn gia ch?m  s?c kh?e c?a qu v?.  Qu v? v?n ti?p t?c b? nn.  Ch? c?m th?y ?au ? cc vng c?a b?ng, ch?ng h?n nh? ? ph?n b?ng d??i bn ph?i ho?c bn tri.  Quy? vi? ?i ngoi phn c ma?u ho??c phn ma?u ?en, ho??c phn trng nh? h??c i?n.  Qu v? b? ?au r?t nhi?u, co th?t ho??c ch???ng b?ng.  Qu v? c cc d?u hi?u m?t n??c, ch?ng h?n nh?: ? N??c ti?u s?m mu, r?t t n??c ti?u, ho?c khng c n??c ti?u. ? Mi n?t n?. ? Mi?ng kh. ? M?t tr?ng. ? Bu?n ng?. ? Y?u. Thng tin ny khng nh?m m?c ?ch thay th? cho l?i khuyn m chuyn gia ch?m  s?c kh?e ni v?i qu v?. Hy b?o ??m qu v?  ph?i th?o lu?n b?t k? v?n ?? g m qu v? c v?i chuyn gia ch?m  s?c kh?e c?a qu v?. Document Released: 07/19/2005 Document Revised: 07/08/2016 Document Reviewed: 12/31/2015 Elsevier Interactive Patient Education  [employer].    If you have lab work done today you will be contacted with your lab results within the next 2 weeks.  If you have not heard from [place] then please contact  us. The fastest way to get your results is to register for My Chart.   IF you received an x-ray today, you will receive an invoice from [facility]. Please contact [facility] at [phone] with questions or concerns regarding your invoice.   IF you received labwork today, you will receive an invoice from [facility]. Please contact [facility] at [phone] with questions or concerns regarding your invoice.   Our billing staff will not be able to assist you with questions regarding bills from these companies.  You will be contacted with the lab results as soon as they are available. The fastest way to get your results is to activate your My Chart account. Instructions are located on the last page of this paperwork. If you have not heard from [place] regarding the results in 2 weeks, please contact this office.       I personally performed the services described in this documentation, which was scribed in my presence. The recorded information has been reviewed and considered for accuracy and completeness, addended by me as needed, and agree with information above.  Signed,   Meredith Staggers, MD Primary Care at [facility].  04/17/18 9:39 AM

## 2020-05-15 ENCOUNTER — Encounter: Payer: Self-pay | Admitting: *Deleted

## 2023-03-30 ENCOUNTER — Other Ambulatory Visit: Payer: Self-pay

## 2023-03-30 ENCOUNTER — Emergency Department (HOSPITAL_COMMUNITY): Payer: 59

## 2023-03-30 ENCOUNTER — Emergency Department (HOSPITAL_COMMUNITY)
Admission: EM | Admit: 2023-03-30 | Discharge: 2023-03-30 | Disposition: A | Discharge status: Home / Self Care | Payer: 59 | Attending: Emergency Medicine | Admitting: Emergency Medicine

## 2023-03-30 ENCOUNTER — Encounter (HOSPITAL_COMMUNITY): Payer: Self-pay

## 2023-03-30 DIAGNOSIS — M19112 Post-traumatic osteoarthritis, left shoulder: Secondary | ICD-10-CM | POA: Diagnosis not present

## 2023-03-30 DIAGNOSIS — R079 Chest pain, unspecified: Secondary | ICD-10-CM | POA: Diagnosis not present

## 2023-03-30 DIAGNOSIS — M13812 Other specified arthritis, left shoulder: Secondary | ICD-10-CM | POA: Diagnosis not present

## 2023-03-30 DIAGNOSIS — S20212A Contusion of left front wall of thorax, initial encounter: Secondary | ICD-10-CM | POA: Diagnosis not present

## 2023-03-30 DIAGNOSIS — M25512 Pain in left shoulder: Secondary | ICD-10-CM | POA: Diagnosis not present

## 2023-03-30 DIAGNOSIS — S199XXA Unspecified injury of neck, initial encounter: Secondary | ICD-10-CM | POA: Diagnosis not present

## 2023-03-30 DIAGNOSIS — S40012A Contusion of left shoulder, initial encounter: Secondary | ICD-10-CM | POA: Diagnosis not present

## 2023-03-30 DIAGNOSIS — Y92410 Unspecified street and highway as the place of occurrence of the external cause: Secondary | ICD-10-CM | POA: Insufficient documentation

## 2023-03-30 DIAGNOSIS — M19012 Primary osteoarthritis, left shoulder: Secondary | ICD-10-CM | POA: Diagnosis not present

## 2023-03-30 DIAGNOSIS — S0990XA Unspecified injury of head, initial encounter: Secondary | ICD-10-CM | POA: Diagnosis not present

## 2023-03-30 DIAGNOSIS — M546 Pain in thoracic spine: Secondary | ICD-10-CM | POA: Diagnosis not present

## 2023-03-30 MED ORDER — CYCLOBENZAPRINE HCL 10 MG PO TABS: 10.0000 mg | ORAL_TABLET | Freq: Two times a day (BID) | ORAL | 0 refills | Status: AC | PRN

## 2023-03-30 MED ORDER — ACETAMINOPHEN 325 MG PO TABS: 650.0000 mg | ORAL_TABLET | Freq: Four times a day (QID) | ORAL | 0 refills | Status: DC | PRN

## 2023-03-30 MED ORDER — IBUPROFEN 600 MG PO TABS: 600.0000 mg | ORAL_TABLET | Freq: Four times a day (QID) | ORAL | 0 refills | Status: DC | PRN

## 2023-03-30 MED ORDER — CYCLOBENZAPRINE HCL 10 MG PO TABS: 10.0000 mg | ORAL_TABLET | Freq: Two times a day (BID) | ORAL | 0 refills | Status: DC | PRN

## 2023-03-30 MED ORDER — ACETAMINOPHEN 325 MG PO TABS
650.0000 mg | ORAL_TABLET | Freq: Once | ORAL | Status: AC
  Administered 2023-03-30: 650 mg via ORAL
  Filled 2023-03-30: qty 2

## 2023-03-30 MED ORDER — ACETAMINOPHEN 325 MG PO TABS: 650.0000 mg | ORAL_TABLET | Freq: Four times a day (QID) | ORAL | 0 refills | Status: AC | PRN

## 2023-03-30 MED ORDER — IBUPROFEN 800 MG PO TABS
800.0000 mg | ORAL_TABLET | Freq: Once | ORAL | Status: AC
  Administered 2023-03-30: 800 mg via ORAL
  Filled 2023-03-30: qty 1

## 2023-03-30 MED ORDER — IBUPROFEN 600 MG PO TABS: 600.0000 mg | ORAL_TABLET | Freq: Four times a day (QID) | ORAL | 0 refills | Status: AC | PRN
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
## 2023-03-30 NOTE — Discharge Instructions (Addendum)
Do NOT drive if you are feeling drowsy, dizzy, or sick.  Wear the sling at ALL times except when you are showering.
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
## 2023-04-01 ENCOUNTER — Ambulatory Visit
Admission: EM | Admit: 2023-04-01 | Discharge: 2023-04-01 | Disposition: A | Discharge status: Home / Self Care | Payer: 59

## 2023-04-01 DIAGNOSIS — M19012 Primary osteoarthritis, left shoulder: Secondary | ICD-10-CM | POA: Diagnosis not present

## 2023-04-01 MED ORDER — PREDNISONE 20 MG PO TABS
40.0000 mg | ORAL_TABLET | Freq: Every day | ORAL | 0 refills | Status: AC
Start: 2023-04-01 — End: 2023-04-06

## 2023-04-01 NOTE — ED Notes (Addendum)
Numerous attempts with interpreter help to ask patient about his needs today and any PCP information and/or upcoming appointment to address his continual needs in the future.  Based on the Last Rx for Lipitor Hyacinth Meeker, [place], [place]) was the prescriber (with [facility]). He states "no".   Discussed with provider so she could get further details.  B. Roten CMA
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
## 2023-04-21 DIAGNOSIS — Z029 Encounter for administrative examinations, unspecified: Secondary | ICD-10-CM | POA: Diagnosis not present

## 2023-05-12 ENCOUNTER — Other Ambulatory Visit: Payer: Self-pay | Admitting: Internal Medicine

## 2023-05-12 DIAGNOSIS — I10 Essential (primary) hypertension: Secondary | ICD-10-CM | POA: Diagnosis not present

## 2023-05-12 DIAGNOSIS — F17218 Nicotine dependence, cigarettes, with other nicotine-induced disorders: Secondary | ICD-10-CM

## 2023-05-12 DIAGNOSIS — Z23 Encounter for immunization: Secondary | ICD-10-CM | POA: Diagnosis not present

## 2023-05-12 DIAGNOSIS — Z1211 Encounter for screening for malignant neoplasm of colon: Secondary | ICD-10-CM | POA: Diagnosis not present

## 2023-05-12 DIAGNOSIS — E785 Hyperlipidemia, unspecified: Secondary | ICD-10-CM | POA: Diagnosis not present

## 2023-05-12 DIAGNOSIS — Z0000 Encounter for general adult medical examination without abnormal findings: Secondary | ICD-10-CM | POA: Diagnosis not present

## 2023-05-12 DIAGNOSIS — Z125 Encounter for screening for malignant neoplasm of prostate: Secondary | ICD-10-CM | POA: Diagnosis not present

## 2023-06-16 ENCOUNTER — Inpatient Hospital Stay: Admission: RE | Admit: 2023-06-16 | Payer: 59 | Source: Ambulatory Visit

## 2023-11-10 DIAGNOSIS — I10 Essential (primary) hypertension: Secondary | ICD-10-CM | POA: Diagnosis not present

## 2024-05-17 DIAGNOSIS — Z0000 Encounter for general adult medical examination without abnormal findings: Secondary | ICD-10-CM | POA: Diagnosis not present

## 2024-05-17 DIAGNOSIS — I10 Essential (primary) hypertension: Secondary | ICD-10-CM | POA: Diagnosis not present

## 2024-05-17 DIAGNOSIS — E785 Hyperlipidemia, unspecified: Secondary | ICD-10-CM | POA: Diagnosis not present
